# Patient Record
Sex: Female | Born: 1935 | Race: White | Hispanic: No | Marital: Married | Smoking: Never smoker
Health system: Southern US, Community
[De-identification: ages and names within clinical notes are randomized; demographics above are authoritative.]

## PROBLEM LIST (undated history)

## (undated) DIAGNOSIS — T884XXA Failed or difficult intubation, initial encounter: Secondary | ICD-10-CM

## (undated) DIAGNOSIS — T7840XA Allergy, unspecified, initial encounter: Secondary | ICD-10-CM

## (undated) DIAGNOSIS — F419 Anxiety disorder, unspecified: Secondary | ICD-10-CM

## (undated) DIAGNOSIS — K219 Gastro-esophageal reflux disease without esophagitis: Secondary | ICD-10-CM

## (undated) DIAGNOSIS — E079 Disorder of thyroid, unspecified: Secondary | ICD-10-CM

## (undated) HISTORY — DX: Allergy, unspecified, initial encounter: T78.40XA

## (undated) HISTORY — PX: TONSILLECTOMY: SUR1361

## (undated) HISTORY — PX: CATARACT EXTRACTION, BILATERAL: SHX1313

## (undated) HISTORY — PX: DIAGNOSTIC LAPAROSCOPY: SUR761

## (undated) HISTORY — PX: EYE SURGERY: SHX253

## (undated) HISTORY — DX: Disorder of thyroid, unspecified: E07.9

## (undated) HISTORY — PX: CHOLECYSTECTOMY: SHX55

## (undated) HISTORY — DX: Anxiety disorder, unspecified: F41.9

## (undated) HISTORY — DX: Gastro-esophageal reflux disease without esophagitis: K21.9

---

## 1998-08-13 ENCOUNTER — Other Ambulatory Visit: Admission: RE | Admit: 1998-08-13 | Discharge: 1998-08-13 | Payer: Self-pay | Admitting: Obstetrics and Gynecology

## 1999-08-29 ENCOUNTER — Other Ambulatory Visit: Admission: RE | Admit: 1999-08-29 | Discharge: 1999-08-29 | Payer: Self-pay | Admitting: Obstetrics and Gynecology

## 1999-10-27 ENCOUNTER — Ambulatory Visit (HOSPITAL_COMMUNITY): Admission: RE | Admit: 1999-10-27 | Discharge: 1999-10-27 | Payer: Self-pay | Admitting: Gastroenterology

## 2000-10-30 ENCOUNTER — Other Ambulatory Visit: Admission: RE | Admit: 2000-10-30 | Discharge: 2000-10-30 | Payer: Self-pay | Admitting: Obstetrics and Gynecology

## 2000-12-26 ENCOUNTER — Encounter: Payer: Self-pay | Admitting: Emergency Medicine

## 2000-12-26 ENCOUNTER — Emergency Department (HOSPITAL_COMMUNITY): Admission: EM | Admit: 2000-12-26 | Discharge: 2000-12-26 | Payer: Self-pay | Admitting: Emergency Medicine

## 2000-12-28 ENCOUNTER — Encounter (HOSPITAL_BASED_OUTPATIENT_CLINIC_OR_DEPARTMENT_OTHER): Payer: Self-pay | Admitting: General Surgery

## 2000-12-29 ENCOUNTER — Encounter (HOSPITAL_BASED_OUTPATIENT_CLINIC_OR_DEPARTMENT_OTHER): Payer: Self-pay | Admitting: General Surgery

## 2000-12-29 ENCOUNTER — Inpatient Hospital Stay (HOSPITAL_COMMUNITY): Admission: RE | Admit: 2000-12-29 | Discharge: 2000-12-30 | Payer: Self-pay | Admitting: General Surgery

## 2004-07-04 ENCOUNTER — Ambulatory Visit (HOSPITAL_COMMUNITY): Admission: RE | Admit: 2004-07-04 | Discharge: 2004-07-04 | Payer: Self-pay | Admitting: Gastroenterology

## 2004-12-12 ENCOUNTER — Other Ambulatory Visit: Admission: RE | Admit: 2004-12-12 | Discharge: 2004-12-12 | Payer: Self-pay | Admitting: Family Medicine

## 2012-06-14 ENCOUNTER — Other Ambulatory Visit: Payer: Self-pay | Admitting: Otolaryngology

## 2012-06-14 DIAGNOSIS — R221 Localized swelling, mass and lump, neck: Secondary | ICD-10-CM

## 2012-06-27 ENCOUNTER — Ambulatory Visit
Admission: RE | Admit: 2012-06-27 | Discharge: 2012-06-27 | Disposition: A | Discharge status: Home / Self Care | Payer: Medicare Other | Source: Ambulatory Visit | Attending: Otolaryngology | Admitting: Otolaryngology

## 2012-06-27 DIAGNOSIS — R221 Localized swelling, mass and lump, neck: Secondary | ICD-10-CM

## 2012-06-28 ENCOUNTER — Other Ambulatory Visit: Payer: Self-pay | Admitting: Otolaryngology

## 2012-06-28 DIAGNOSIS — E042 Nontoxic multinodular goiter: Secondary | ICD-10-CM

## 2012-07-02 ENCOUNTER — Other Ambulatory Visit (HOSPITAL_COMMUNITY)
Admission: RE | Admit: 2012-07-02 | Discharge: 2012-07-02 | Disposition: A | Discharge status: Home / Self Care | Payer: Medicare Other | Source: Ambulatory Visit | Attending: Interventional Radiology | Admitting: Interventional Radiology

## 2012-07-02 ENCOUNTER — Ambulatory Visit
Admission: RE | Admit: 2012-07-02 | Discharge: 2012-07-02 | Disposition: A | Discharge status: Home / Self Care | Payer: Medicare Other | Source: Ambulatory Visit | Attending: Otolaryngology | Admitting: Otolaryngology

## 2012-07-02 DIAGNOSIS — E042 Nontoxic multinodular goiter: Secondary | ICD-10-CM

## 2012-07-02 DIAGNOSIS — E041 Nontoxic single thyroid nodule: Secondary | ICD-10-CM | POA: Insufficient documentation

## 2013-06-17 ENCOUNTER — Other Ambulatory Visit: Payer: Self-pay | Admitting: Otolaryngology

## 2013-06-17 DIAGNOSIS — H6120 Impacted cerumen, unspecified ear: Secondary | ICD-10-CM

## 2013-06-24 ENCOUNTER — Other Ambulatory Visit: Payer: Self-pay | Admitting: Otolaryngology

## 2013-06-24 DIAGNOSIS — H6120 Impacted cerumen, unspecified ear: Secondary | ICD-10-CM

## 2013-06-30 ENCOUNTER — Ambulatory Visit
Admission: RE | Admit: 2013-06-30 | Discharge: 2013-06-30 | Disposition: A | Discharge status: Home / Self Care | Payer: Medicare Other | Source: Ambulatory Visit | Attending: Otolaryngology | Admitting: Otolaryngology

## 2013-06-30 DIAGNOSIS — H6120 Impacted cerumen, unspecified ear: Secondary | ICD-10-CM

## 2014-02-01 ENCOUNTER — Ambulatory Visit (INDEPENDENT_AMBULATORY_CARE_PROVIDER_SITE_OTHER): Payer: Medicare Other | Admitting: Family Medicine

## 2014-02-01 VITALS — BP 130/80 | HR 77 | Temp 97.9°F | Resp 16 | Ht 64.5 in | Wt 133.2 lb

## 2014-02-01 DIAGNOSIS — L282 Other prurigo: Secondary | ICD-10-CM

## 2014-02-01 DIAGNOSIS — R21 Rash and other nonspecific skin eruption: Secondary | ICD-10-CM

## 2014-02-01 MED ORDER — TRIAMCINOLONE ACETONIDE 0.1 % EX CREA: 1.0000 "application " | TOPICAL_CREAM | Freq: Three times a day (TID) | CUTANEOUS | Status: DC | PRN

## 2014-02-01 MED ORDER — MUPIROCIN 2 % EX OINT: TOPICAL_OINTMENT | CUTANEOUS | Status: DC

## 2014-02-01 NOTE — Progress Notes (Signed)
 Chief Complaint:  Chief Complaint  Patient presents with  . other    x3 days ago pt states she has been having little discomfort on the middle left area of her back; she believes it is shingles; OTC Hydrocortisone with little relief  . Fever    pt states she had a fever x1 day ago    HPI: Jacqueline Harrison is a 78 y.o. female who is here for  rash on her left side of her back started about 3 days ago, she is worried about shingles. SHe ahs tried otc hydrocortisone with minimal releif. It actaully feels better today, she states she ahd subjective fevers, no other sxs. Worried about shingles, no blistering of rash, no pain , numbness or tingling.    Past Medical History  Diagnosis Date  . Allergy   . Anxiety   . Cataract   . GERD (gastroesophageal reflux disease)   . Thyroid disease    Past Surgical History  Procedure Laterality Date  . Cholecystectomy    . Eye surgery     History   Social History  . Marital Status: Married    Spouse Name: N/A    Number of Children: N/A  . Years of Education: N/A   Social History Main Topics  . Smoking status: Never Smoker   . Smokeless tobacco: None  . Alcohol Use: None  . Drug Use: None  . Sexual Activity: None   Other Topics Concern  . None   Social History Narrative  . None   Family History  Problem Relation Age of Onset  . Cancer Mother   . Hypertension Mother   . Cancer Father   . Mental illness Father   . Heart disease Father   . Heart disease Maternal Grandmother   . Hyperlipidemia Maternal Grandmother   . Cancer Maternal Grandfather   . Mental illness Maternal Grandfather   . Heart disease Paternal Grandfather   . Cancer Sister   . Hypertension Sister   . Mental illness Sister   . Cancer Brother    Allergies  Allergen Reactions  . Peanut-Containing Drug Products Nausea And Vomiting   Prior to Admission medications   Medication Sig Start Date End Date Taking? Authorizing Provider  ALPRAZolam Prudy Feeler)  1 MG tablet Take 1 mg by mouth daily.   Yes Historical Provider, MD  CALCIUM PO Take by mouth.   Yes Historical Provider, MD  Cholecalciferol (VITAMIN D PO) Take by mouth.   Yes Historical Provider, MD  Omega-3 Fatty Acids (FISH OIL PO) Take by mouth.   Yes Historical Provider, MD  mupirocin ointment (BACTROBAN) 2 % Apply to affected area BID for 7 days 02/01/14    P , DO  triamcinolone cream (KENALOG) 0.1 % Apply 1 application topically 3 (three) times daily as needed. 02/01/14    P , DO     ROS: The patient denies night sweats, unintentional weight loss, chest pain, palpitations, wheezing, dyspnea on exertion, nausea, vomiting, abdominal pain, dysuria, hematuria, melena, numbness, weakness, or tingling.   All other systems have been reviewed and were otherwise negative with the exception of those mentioned in the HPI and as above.    PHYSICAL EXAM: Filed Vitals:   02/01/14 0956  BP: 130/80  Pulse: 77  Temp: 97.9 F (36.6 C)  Resp: 16   Filed Vitals:   02/01/14 0956  Height: 5' 4.5" (1.638 m)  Weight: 133 lb 3.2 oz (60.419 kg)   Body mass  index is 22.52 kg/(m^2).  General: Alert, no acute distress HEENT:  Normocephalic, atraumatic, oropharynx patent. EOMI, PERRLA Cardiovascular:  Regular rate and rhythm, no rubs murmurs or gallops.  No Carotid bruits, radial pulse intact. No pedal edema.  Respiratory: Clear to auscultation bilaterally.  No wheezes, rales, or rhonchi.  No cyanosis, no use of accessory musculature GI: No organomegaly, abdomen is soft and non-tender, positive bowel sounds.  No masses. Skin: + papular erythematous rash in 3 lesions that look like bug bites Neurologic: Facial musculature symmetric. Psychiatric: Patient is appropriate throughout our interaction. Lymphatic: No cervical lymphadenopathy Musculoskeletal: Gait intact.   LABS: No results found for this or any previous visit.   EKG/XRAY:   Primary read interpreted by Dr. Conley Rolls at  [facility].   ASSESSMENT/PLAN: Encounter Diagnoses  Name Primary?  . Rash and nonspecific skin eruption Yes  . Pruritic rash    More bug bite in appearance, she does go walking with her husband daily She does not appear to have shingles Will try triamicinolone cream  and bactroban  F/u prn by phone if worsening rash then will rx  abx , she will call me  Gross sideeffects, risk and benefits, and alternatives of medications d/w patient. Patient is aware that all medications have potential sideeffects and we are unable to predict every sideeffect or drug-drug interaction that may occur.  Hamilton Capri PHUONG, DO 02/01/2014 10:43 AM

## 2014-08-26 DIAGNOSIS — E785 Hyperlipidemia, unspecified: Secondary | ICD-10-CM | POA: Diagnosis not present

## 2014-08-26 DIAGNOSIS — M810 Age-related osteoporosis without current pathological fracture: Secondary | ICD-10-CM | POA: Diagnosis not present

## 2014-08-26 DIAGNOSIS — I10 Essential (primary) hypertension: Secondary | ICD-10-CM | POA: Diagnosis not present

## 2014-08-26 DIAGNOSIS — E042 Nontoxic multinodular goiter: Secondary | ICD-10-CM | POA: Diagnosis not present

## 2014-08-26 DIAGNOSIS — N390 Urinary tract infection, site not specified: Secondary | ICD-10-CM | POA: Diagnosis not present

## 2014-08-26 DIAGNOSIS — R8290 Unspecified abnormal findings in urine: Secondary | ICD-10-CM | POA: Diagnosis not present

## 2014-09-02 DIAGNOSIS — Z0000 Encounter for general adult medical examination without abnormal findings: Secondary | ICD-10-CM | POA: Diagnosis not present

## 2014-09-02 DIAGNOSIS — H269 Unspecified cataract: Secondary | ICD-10-CM | POA: Diagnosis not present

## 2014-09-02 DIAGNOSIS — I10 Essential (primary) hypertension: Secondary | ICD-10-CM | POA: Diagnosis not present

## 2014-09-02 DIAGNOSIS — M810 Age-related osteoporosis without current pathological fracture: Secondary | ICD-10-CM | POA: Diagnosis not present

## 2014-09-02 DIAGNOSIS — E042 Nontoxic multinodular goiter: Secondary | ICD-10-CM | POA: Diagnosis not present

## 2014-09-07 DIAGNOSIS — Z1212 Encounter for screening for malignant neoplasm of rectum: Secondary | ICD-10-CM | POA: Diagnosis not present

## 2014-10-20 ENCOUNTER — Other Ambulatory Visit: Payer: Self-pay | Admitting: Gastroenterology

## 2014-10-20 DIAGNOSIS — Z800 Family history of malignant neoplasm of digestive organs: Secondary | ICD-10-CM | POA: Diagnosis not present

## 2014-10-20 DIAGNOSIS — K5730 Diverticulosis of large intestine without perforation or abscess without bleeding: Secondary | ICD-10-CM | POA: Diagnosis not present

## 2014-10-20 DIAGNOSIS — Z86010 Personal history of colonic polyps: Secondary | ICD-10-CM | POA: Diagnosis not present

## 2014-10-20 DIAGNOSIS — Z1211 Encounter for screening for malignant neoplasm of colon: Secondary | ICD-10-CM | POA: Diagnosis not present

## 2014-12-11 ENCOUNTER — Encounter (HOSPITAL_COMMUNITY): Payer: Self-pay | Admitting: *Deleted

## 2014-12-11 NOTE — Progress Notes (Signed)
12-11-14 1530 Dr. Dot LanesG. Rose, anesthesiologist made aware of patient comments "small mouth, airway- potential for difficult intubation unless small tube use. Jona Zappone,RN

## 2014-12-22 ENCOUNTER — Encounter (HOSPITAL_COMMUNITY): Payer: Self-pay

## 2014-12-22 ENCOUNTER — Encounter (HOSPITAL_COMMUNITY)
Admission: RE | Disposition: A | Discharge status: Home / Self Care | Payer: Self-pay | Source: Ambulatory Visit | Attending: Gastroenterology

## 2014-12-22 ENCOUNTER — Ambulatory Visit (HOSPITAL_COMMUNITY)
Admission: RE | Admit: 2014-12-22 | Discharge: 2014-12-22 | Disposition: A | Discharge status: Home / Self Care | Payer: Medicare Other | Source: Ambulatory Visit | Attending: Gastroenterology | Admitting: Gastroenterology

## 2014-12-22 ENCOUNTER — Ambulatory Visit (HOSPITAL_COMMUNITY): Payer: Medicare Other | Admitting: Anesthesiology

## 2014-12-22 DIAGNOSIS — F419 Anxiety disorder, unspecified: Secondary | ICD-10-CM | POA: Diagnosis not present

## 2014-12-22 DIAGNOSIS — Z1211 Encounter for screening for malignant neoplasm of colon: Secondary | ICD-10-CM | POA: Diagnosis not present

## 2014-12-22 DIAGNOSIS — Z79899 Other long term (current) drug therapy: Secondary | ICD-10-CM | POA: Diagnosis not present

## 2014-12-22 DIAGNOSIS — K219 Gastro-esophageal reflux disease without esophagitis: Secondary | ICD-10-CM | POA: Diagnosis not present

## 2014-12-22 DIAGNOSIS — Z86010 Personal history of colonic polyps: Secondary | ICD-10-CM | POA: Diagnosis not present

## 2014-12-22 DIAGNOSIS — K640 First degree hemorrhoids: Secondary | ICD-10-CM | POA: Insufficient documentation

## 2014-12-22 DIAGNOSIS — Z800 Family history of malignant neoplasm of digestive organs: Secondary | ICD-10-CM | POA: Insufficient documentation

## 2014-12-22 DIAGNOSIS — K5790 Diverticulosis of intestine, part unspecified, without perforation or abscess without bleeding: Secondary | ICD-10-CM | POA: Diagnosis not present

## 2014-12-22 DIAGNOSIS — K5730 Diverticulosis of large intestine without perforation or abscess without bleeding: Secondary | ICD-10-CM | POA: Diagnosis not present

## 2014-12-22 DIAGNOSIS — K648 Other hemorrhoids: Secondary | ICD-10-CM | POA: Diagnosis not present

## 2014-12-22 HISTORY — PX: COLONOSCOPY WITH PROPOFOL: SHX5780

## 2014-12-22 HISTORY — DX: Failed or difficult intubation, initial encounter: T88.4XXA

## 2014-12-22 SURGERY — COLONOSCOPY WITH PROPOFOL
Anesthesia: Monitor Anesthesia Care

## 2014-12-22 MED ORDER — PROPOFOL 10 MG/ML IV BOLUS
INTRAVENOUS | Status: DC | PRN
  Administered 2014-12-22 (×2): 20 mg via INTRAVENOUS

## 2014-12-22 MED ORDER — PROPOFOL INFUSION 10 MG/ML OPTIME
INTRAVENOUS | Status: DC | PRN
  Administered 2014-12-22: 140 ug/kg/min via INTRAVENOUS

## 2014-12-22 MED ORDER — PROPOFOL 10 MG/ML IV BOLUS
INTRAVENOUS | Status: AC
  Filled 2014-12-22: qty 20

## 2014-12-22 MED ORDER — LACTATED RINGERS IV SOLN
INTRAVENOUS | Status: DC
Start: 2014-12-22 — End: 2014-12-22
  Administered 2014-12-22: 1000 mL via INTRAVENOUS

## 2014-12-22 MED ORDER — SODIUM CHLORIDE 0.9 % IV SOLN: INTRAVENOUS | Status: DC

## 2014-12-22 MED ORDER — FENTANYL CITRATE (PF) 100 MCG/2ML IJ SOLN
INTRAMUSCULAR | Status: DC | PRN
  Administered 2014-12-22: 25 ug via INTRAVENOUS

## 2014-12-22 MED ORDER — FENTANYL CITRATE (PF) 100 MCG/2ML IJ SOLN
INTRAMUSCULAR | Status: AC
  Filled 2014-12-22: qty 2

## 2014-12-22 MED ORDER — PROPOFOL 10 MG/ML IV BOLUS
INTRAVENOUS | Status: AC
Start: 2014-12-22 — End: 2014-12-22
  Filled 2014-12-22: qty 20

## 2014-12-22 SURGICAL SUPPLY — 21 items

## 2014-12-22 NOTE — Op Note (Signed)
[facility] [address] [place], [address]   OPERATIVE PROCEDURE REPORT  PATIENT: Jacqueline Harrison, Jacqueline Harrison  MR#: [id number] BIRTHDATE: 09/07/1935 GENDER: female ENDOSCOPIST: Lorenza Burton, MD ASSISTANT:   Olene Craven, technician & Omelia Blackwater, RN. PROCEDURE DATE: 01-06-15 PRE-PROCEDURE PREPARATION: Patient fasted for 4 hours prior to procedure. The patient was prepped with a gallon of Golytely the night prior to the procedure.  The patient has fasted for 4 hours prior to the procedure PRE-PROCEDURE PHYSICAL: Patient has stable vital signs.  Neck is supple.  There is no JVD, thyromegaly or LAD.  Chest clear to auscultation.  S1 and S2 regular.  Abdomen soft, non-distended, non-tender with NABS. PROCEDURE:     Colonoscopy, diagnostic ASA CLASS:     Class II INDICATIONS:     1. Personal history of adenomatous colonic polyps 2. Colorectal cancer screening-family history of colon cancer in both parents and a paternal cousin. MEDICATIONS:     Monitored anesthesia care.  DESCRIPTION OF PROCEDURE: After the risks, benefits, and alternatives of the procedure were thoroughly explained [including a 10% missed rate of cancer and polyps], informed consent was obtained. Digital rectal exam was performed. The Pentax adult Colonscope [phone]  was introduced through the anus  and advanced to the terminal ileum which was intubated for a short distance , limited by No adverse events experienced. The quality of the prep was good. Multiple washes were done. Small lesions could be missed. The instrument was then slowly withdrawn as the colon was fully examined. Estimated blood loss is zero unless otherwise noted in this procedure report.     COLON FINDINGS: There was mild diverticulosis noted in the sigmoid colon. The rest of the colonic mucosa appeared healthy with a normal vascular pattern. No masses, polyps, or AVMs were noted. The appendiceal orifice and the ICV  were identified and photographed. The terminal ileum appeared normal.  Retroflexed views revealed small non-bleeding internal hemorrhoids. The patient tolerated the procedure without immediate complications. The scope was then withdrawn from the patient and the procedure terminated.  TIME TO CECUM:   3 minutes 00 seconds WITHDRAW TIME:  6 minutes 00 seconds  IMPRESSION:     1.  Mild diverticulosis was noted in the sigmoid colon 2.  Small internal grade I hemorrhoids. 3. Otherwise normal colonoscopy upto the terminal ileum.  RECOMMENDATIONS:     1.  Continue current medications 2.  Continue surveillance 3.  High fiber diet with liberal fluid intake. 4.  OP follow-up is advised on a PRN basis.  REPEAT EXAM:      In 3 years  for a repeat colonoscopy.  If the patient has any abnormal GI symptoms in the interim, she have been advised to contact the office as soon as possible for further recommendations.   REFERRED VH:QIONGEX Tisovec, M.D. eSigned:  Lorenza Burton, MD Jan 06, 2015 8:02 AM  CPT CODES:     [phone] Colonoscopy, flexible, proximal to splenic flexure; diagnostic, with or without collection of specimen(s) by brushing or washing, with or without colon decompression (separate procedure) ICD CODES:     K57.30, Diverticulosis Z86.010 Personal history of colonic  polyps; Z80.0 Family history of colon cancer Z12.11 Encounter for screening for malignant neoplasm of colon  The ICD and CPT codes recommended by this software are interpretations from the data that the clinical staff has captured with the software.  The verification of the translation of this report to the ICD and CPT codes and modifiers is the sole responsibility  of the health care institution and practicing physician where this report was generated.  [employer] will not be held responsible for the validity of the ICD and CPT codes included on this report.  AMA assumes no liability for data  contained or not contained herein. CPT is a Publishing rights managerregistered trademark of the [employer].  PATIENT NAME:  Jacqueline Harrison, Jacqueline Harrison MR#: [id number]

## 2014-12-22 NOTE — Anesthesia Postprocedure Evaluation (Signed)
  Anesthesia Post-op Note  Patient: Jacqueline Harrison  Procedure(s) Performed: Procedure(s) (LRB): COLONOSCOPY WITH PROPOFOL (N/A)  Patient Location: PACU  Anesthesia Type: MAC  Level of Consciousness: awake and alert   Airway and Oxygen Therapy: Patient Spontanous Breathing  Post-op Pain: mild  Post-op Assessment: Post-op Vital signs reviewed, Patient's Cardiovascular Status Stable, Respiratory Function Stable, Patent Airway and No signs of Nausea or vomiting  Last Vitals:  Filed Vitals:   12/22/14 0815  BP: 166/64  Pulse: 58  Temp:   Resp: 15    Post-op Vital Signs: stable   Complications: No apparent anesthesia complications

## 2014-12-22 NOTE — Transfer of Care (Signed)
Immediate Anesthesia Transfer of Care Note  Patient: Jacqueline HidalgoSandra J Awbrey  Procedure(s) Performed: Procedure(s): COLONOSCOPY WITH PROPOFOL (N/A)  Patient Location: Endoscopy Unit  Anesthesia Type:MAC  Level of Consciousness: awake  Airway & Oxygen Therapy: Patient Spontanous Breathing and Patient connected to face mask oxygen  Post-op Assessment: Report given to RN and Post -op Vital signs reviewed and stable  Post vital signs: Reviewed and stable  Last Vitals:  Filed Vitals:   12/22/14 0703  BP: 207/71  Pulse: 78  Temp: 36.7 C  Resp: 16    Complications: No apparent anesthesia complications

## 2014-12-22 NOTE — Anesthesia Preprocedure Evaluation (Signed)
Anesthesia Evaluation  Patient identified by MRN, date of birth, ID band Patient awake    Reviewed: Allergy & Precautions, NPO status , Patient's Chart, lab work & pertinent test results  History of Anesthesia Complications (+) DIFFICULT AIRWAY  Airway Mallampati: III  TM Distance: >3 FB Neck ROM: Full    Dental no notable dental hx.    Pulmonary neg pulmonary ROS,  breath sounds clear to auscultation  Pulmonary exam normal       Cardiovascular negative cardio ROS Normal cardiovascular examRhythm:Regular Rate:Normal     Neuro/Psych negative neurological ROS  negative psych ROS   GI/Hepatic negative GI ROS, Neg liver ROS,   Endo/Other  negative endocrine ROS  Renal/GU negative Renal ROS  negative genitourinary   Musculoskeletal negative musculoskeletal ROS (+)   Abdominal   Peds negative pediatric ROS (+)  Hematology negative hematology ROS (+)   Anesthesia Other Findings   Reproductive/Obstetrics negative OB ROS                             Anesthesia Physical Anesthesia Plan  ASA: II  Anesthesia Plan: MAC   Post-op Pain Management:    Induction:   Airway Management Planned: Simple Face Mask  Additional Equipment:   Intra-op Plan:   Post-operative Plan:   Informed Consent: I have reviewed the patients History and Physical, chart, labs and discussed the procedure including the risks, benefits and alternatives for the proposed anesthesia with the patient or authorized representative who has indicated his/her understanding and acceptance.   Dental advisory given  Plan Discussed with: CRNA  Anesthesia Plan Comments:         Anesthesia Quick Evaluation

## 2014-12-22 NOTE — Discharge Instructions (Signed)
Monitored Anesthesia Care Monitored anesthesia care is an anesthesia service for a medical procedure. Anesthesia is the loss of the ability to feel pain. It is produced by medicines called anesthetics. It may affect a small area of your body (local anesthesia), a large area of your body (regional anesthesia), or your entire body (general anesthesia). The need for monitored anesthesia care depends your procedure, your condition, and the potential need for regional or general anesthesia. It is often provided during procedures where:   General anesthesia may be needed if there are complications. This is because you need special care when you are under general anesthesia.   You will be under local or regional anesthesia. This is so that you are able to have higher levels of anesthesia if needed.   You will receive calming medicines (sedatives). This is especially the case if sedatives are given to put you in a semi-conscious state of relaxation (deep sedation). This is because the amount of sedative needed to produce this state can be hard to predict. Too much of a sedative can produce general anesthesia. Monitored anesthesia care is performed by one or more health care providers who have special training in all types of anesthesia. You will need to meet with these health care providers before your procedure. During this meeting, they will ask you about your medical history. They will also give you instructions to follow. (For example, you will need to stop eating and drinking before your procedure. You may also need to stop or change medicines you are taking.) During your procedure, your health care providers will stay with you. They will:   Watch your condition. This includes watching your blood pressure, breathing, and level of pain.   Diagnose and treat problems that occur.   Give medicines if they are needed. These may include calming medicines (sedatives) and anesthetics.   Make sure you are  comfortable.  Having monitored anesthesia care does not necessarily mean that you will be under anesthesia. It does mean that your health care providers will be able to manage anesthesia if you need it or if it occurs. It also means that you will be able to have a different type of anesthesia than you are having if you need it. When your procedure is complete, your health care providers will continue to watch your condition. They will make sure any medicines wear off before you are allowed to go home.  Document Released: 02/22/2005 Document Revised: 10/13/2013 Document Reviewed: 07/10/2012 [facility] Patient Information 2015 [place]. This information is not intended to replace advice given to you by your health care provider. Make sure you discuss any questions you have with your health care provider. Colonoscopy A colonoscopy is an exam to look at the entire large intestine (colon). This exam can help find problems such as tumors, polyps, inflammation, and areas of bleeding. The exam takes about 1 hour.  LET [facility] LLCYOUR HEALTH CARE PROVIDER KNOW ABOUT:   Any allergies you have.  All medicines you are taking, including vitamins, herbs, eye drops, creams, and over-the-counter medicines.  Previous problems you or members of your family have had with the use of anesthetics.  Any blood disorders you have.  Previous surgeries you have had.  Medical conditions you have. RISKS AND COMPLICATIONS  Generally, this is a safe procedure. However, as with any procedure, complications can occur. Possible complications include:  Bleeding.  Tearing or rupture of the colon wall.  Reaction to medicines given during the exam.  Infection (rare). BEFORE  THE PROCEDURE   Ask your health care provider about changing or stopping your regular medicines.  You may be prescribed an oral bowel prep. This involves drinking a large amount of medicated liquid, starting the day before your procedure. The liquid will  cause you to have multiple loose stools until your stool is almost clear or light green. This cleans out your colon in preparation for the procedure.  Do not eat or drink anything else once you have started the bowel prep, unless your health care provider tells you it is safe to do so.  Arrange for someone to drive you home after the procedure. PROCEDURE   You will be given medicine to help you relax (sedative).  You will lie on your side with your knees bent.  A long, flexible tube with a light and camera on the end (colonoscope) will be inserted through the rectum and into the colon. The camera sends video back to a computer screen as it moves through the colon. The colonoscope also releases carbon dioxide gas to inflate the colon. This helps your health care provider see the area better.  During the exam, your health care provider may take a small tissue sample (biopsy) to be examined under a microscope if any abnormalities are found.  The exam is finished when the entire colon has been viewed. AFTER THE PROCEDURE   Do not drive for 24 hours after the exam.  You may have a small amount of blood in your stool.  You may pass moderate amounts of gas and have mild abdominal cramping or bloating. This is caused by the gas used to inflate your colon during the exam.  Ask when your test results will be ready and how you will get your results. Make sure you get your test results. Document Released: 05/26/2000 Document Revised: 03/19/2013 Document Reviewed: 02/03/2013 [facility] Patient Information 2015 [place], [place]. This information is not intended to replace advice given to you by your health care provider. Make sure you discuss any questions you have with your health care provider.

## 2014-12-22 NOTE — H&P (Signed)
Jacqueline Harrison is an 79 y.o. Jacqueline Harrison.   Chief Complaint: Colorectal cancer screening. HPI: 79 year old white female withb a personal history of adenomatous polyps and a family history of colon cancer in both her pareents is here for a screening colonoscopy. She has a history of difficulty intubation.She3 denies having any GI problems at this time. She is a known DIFFICULT INTUBATION.  Past Medical History  Diagnosis Date  . Allergy   . Anxiety   . GERD (gastroesophageal reflux disease)   . Thyroid disease     followed for 2 yrs "thyroid nodules" "stable"  . Complication of anesthesia     "was told has a small mouth"  . Difficult intubation     Potential for difficult intubation if small tube not used" per patient -had issues with this Cholecystectomy 2009-[facility].   Past Surgical History  Procedure Laterality Date  . Cholecystectomy    . Eye surgery    . Tonsillectomy    . Cataract extraction, bilateral Bilateral   . Diagnostic laparoscopy      diagnostic- negative findings -to evaluate ovaries.   Family History  Problem Relation Age of Onset  . Cancer Mother   . Hypertension Mother   . Cancer Father   . Mental illness Father   . Heart disease Father   . Heart disease Maternal Grandmother   . Hyperlipidemia Maternal Grandmother   . Cancer Maternal Grandfather   . Mental illness Maternal Grandfather   . Heart disease Paternal Grandfather   . Cancer Sister   . Hypertension Sister   . Mental illness Sister   . Cancer Brother    Social History:  reports that she has never smoked. She does not have any smokeless tobacco history on file. She reports that she drinks alcohol. She reports that she does not use illicit drugs.  Allergies:  Allergies  Allergen Reactions  . Peanut-Containing Drug Products Nausea And Vomiting   Medications Prior to Admission  Medication Sig Dispense Refill  . ALPRAZolam (XANAX) 1 MG tablet Take 1 mg by mouth at bedtime.     Marland Kitchen. CALCIUM PO Take 1  tablet by mouth 2 (two) times daily. + Magnesium + Zinc    . Carboxymethylcellulose Sodium (REFRESH PLUS OP) Apply 1-2 drops to eye daily.    . Cholecalciferol (VITAMIN D PO) Take 1 tablet by mouth every morning.     . fluticasone (FLONASE) 50 MCG/ACT nasal spray Place 1-2 sprays into both nostrils daily as needed for allergies or rhinitis.    . Multiple Vitamin (MULTIVITAMIN WITH MINERALS) TABS tablet Take 1 tablet by mouth every morning.    . polyvinyl alcohol (LIQUIFILM TEARS) 1.4 % ophthalmic solution Place 1-2 drops into both eyes daily.    Marland Kitchen. acetaminophen (TYLENOL) 500 MG tablet Take 500-1,000 mg by mouth every 6 (six) hours as needed for moderate pain or headache.    . mupirocin ointment (BACTROBAN) 2 % Apply to affected area BID for 7 days (Patient not taking: Reported on 12/02/2014) 30 g 0  . naproxen sodium (ANAPROX) 220 MG tablet Take 220-440 mg by mouth daily as needed (pain).    . Omega-3 Fatty Acids (FISH OIL PO) Take 1 capsule by mouth every morning.     . triamcinolone cream (KENALOG) 0.1 % Apply 1 application topically 3 (three) times daily as needed. (Patient not taking: Reported on 12/02/2014) 30 g 0   No results found for this or any previous visit (from the past 48 hour(s)). No  results found.  Review of Systems  Constitutional: Negative.   HENT: Negative.   Eyes: Negative.   Respiratory: Negative.   Cardiovascular: Negative.   Gastrointestinal: Negative.   Musculoskeletal: Negative.   Skin: Negative.   Neurological: Negative.   Psychiatric/Behavioral: The patient is nervous/anxious.    Blood pressure 207/71, pulse 78, temperature 98.1 F (36.7 C), temperature source Oral, resp. rate 16, height  (1.626 m), weight 60.328 kg (133 lb), SpO2 100 %. Physical Exam  Constitutional: She is oriented to person, place, and time. She appears well-developed and well-nourished.  HENT:  Head: Normocephalic and atraumatic.  Eyes: Conjunctivae and EOM are normal. Pupils are  equal, round, and reactive to light.  Neck: Normal range of motion. Neck supple.  Cardiovascular: Normal rate and regular rhythm.   Respiratory: Effort normal and breath sounds normal.  GI: Soft. Bowel sounds are normal.  Musculoskeletal: Normal range of motion.  Neurological: She is alert and oriented to person, place, and time.  Skin: Skin is warm and dry.  Psychiatric: She has a normal mood and affect. Her behavior is normal. Judgment and thought content normal.    Assessment/Plan Colorectal cancer screening and family history of colon cancer and personal history of colonic polyps-proceed with a colonoscopy at this time. She office notes for details.  [name] 12/22/2014, 7:27 AM

## 2014-12-23 ENCOUNTER — Encounter (HOSPITAL_COMMUNITY): Payer: Self-pay | Admitting: Gastroenterology

## 2014-12-27 IMAGING — US US SOFT TISSUE HEAD/NECK
1 series · 14 of 25 positions shown · non-contrast
Comparison: None.

CLINICAL DATA: Neck mass on physical exam

THYROID ULTRASOUND
TECHNIQUE: Ultrasound examination of the thyroid gland and adjacent
soft tissues was performed.

[Series 1: us soft tissue head/neck · 0.08mm/px · 14 of 52 slices shown]
[im 1/52]
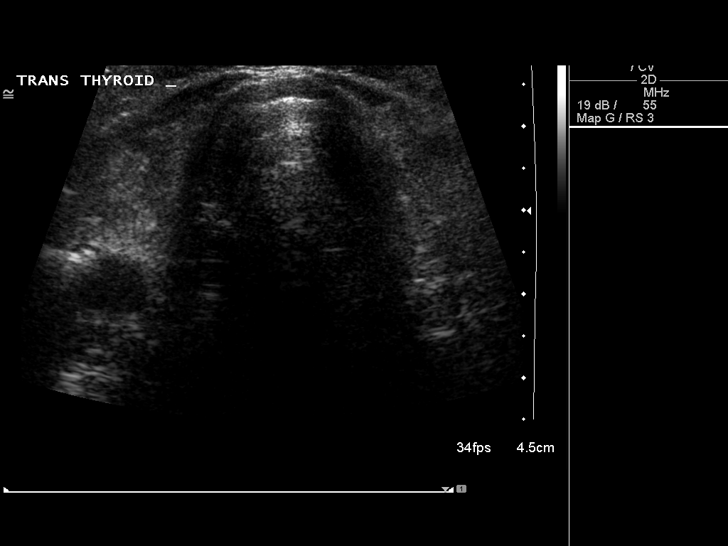
[im 5/52]
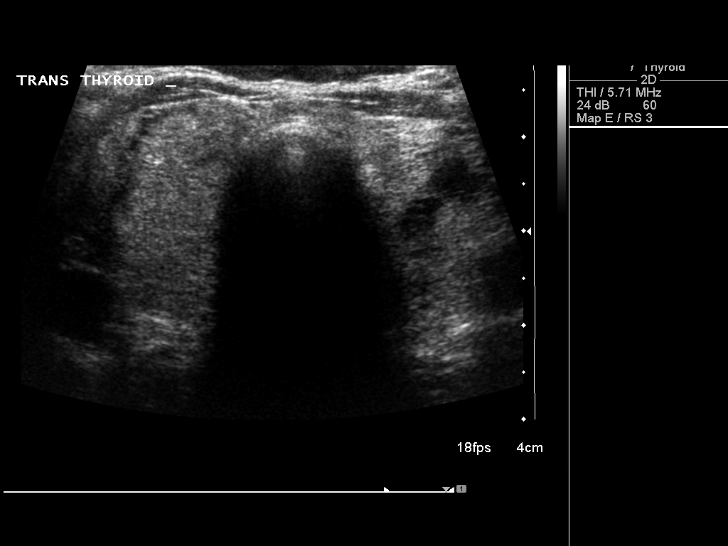
[im 9/52]
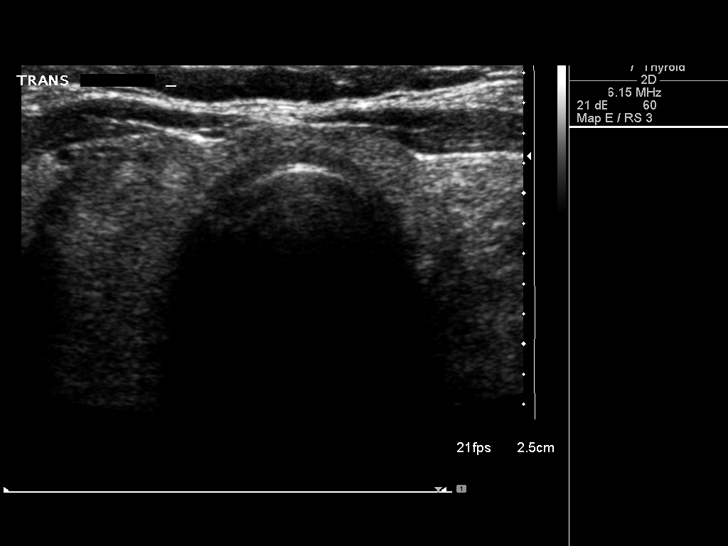
[im 13/52]
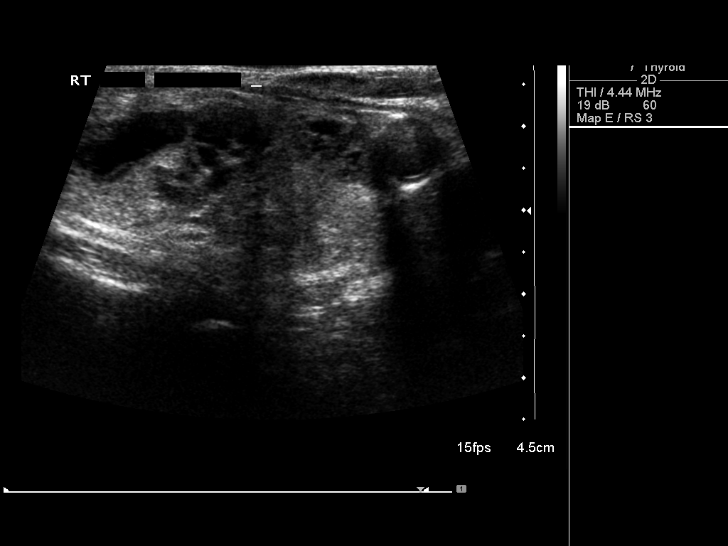
[im 18/52]
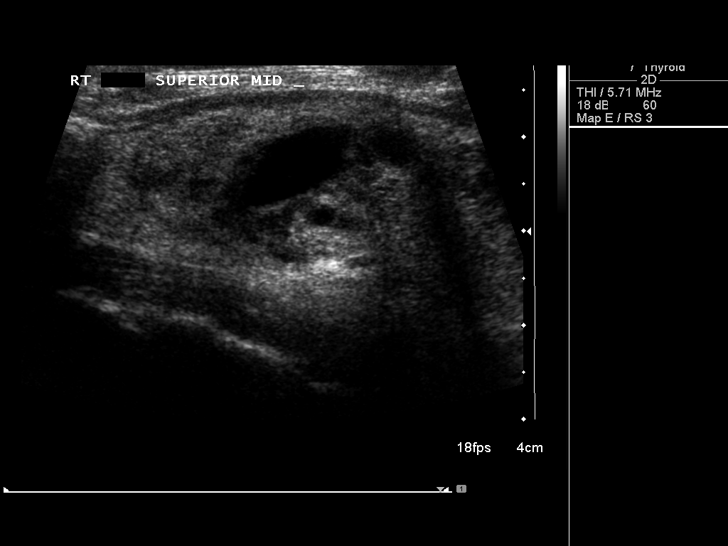
[im 20/52]
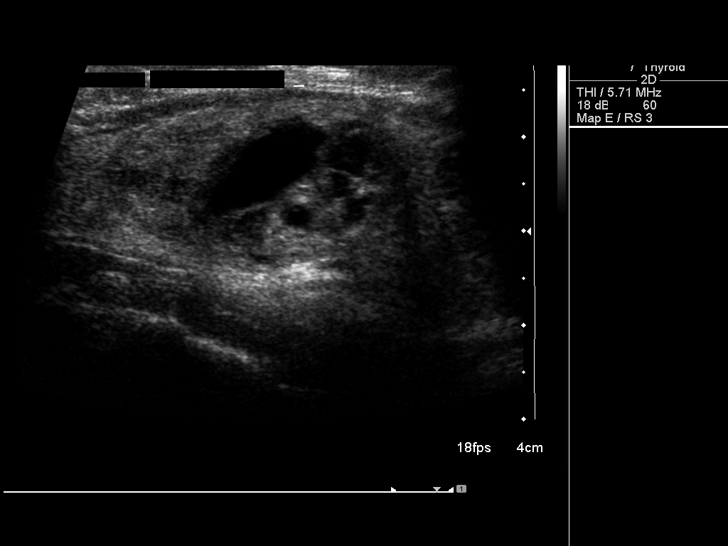
[im 24/52]
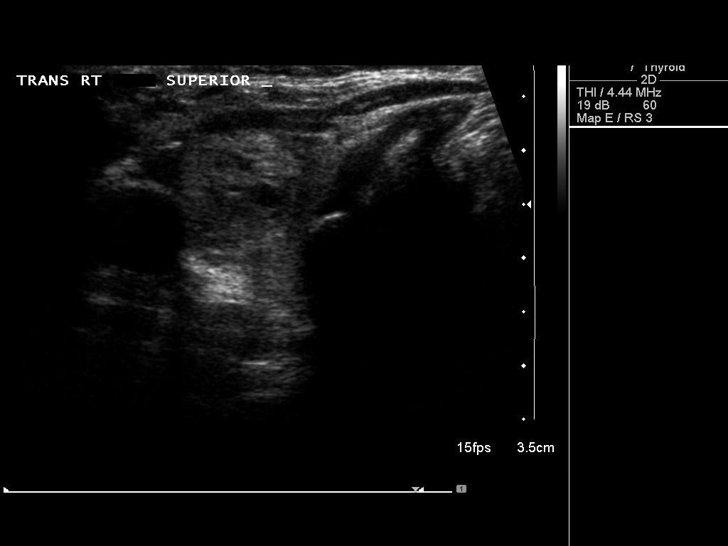
[im 28/52]
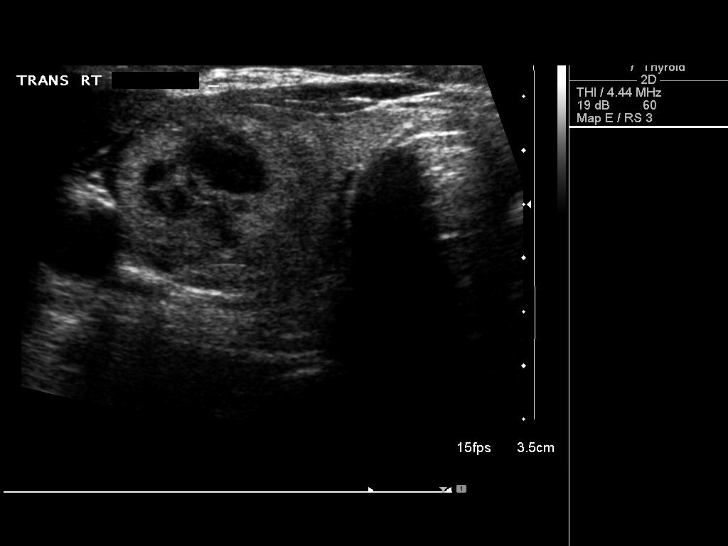
[im 32/52]
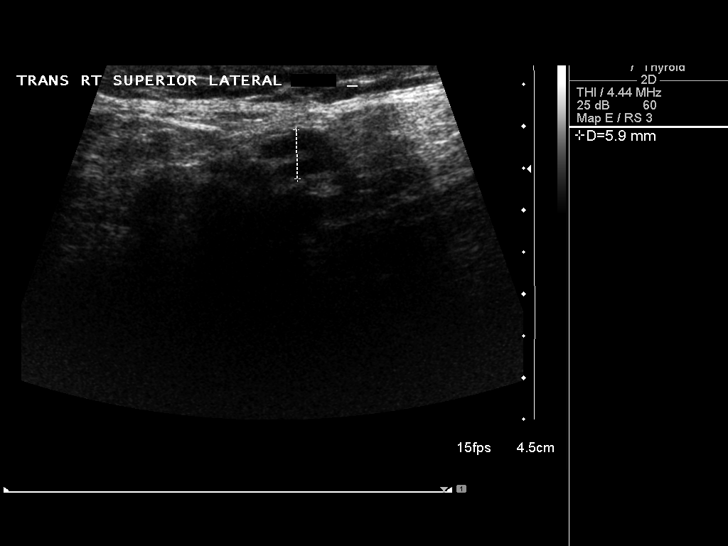
[im 35/52]
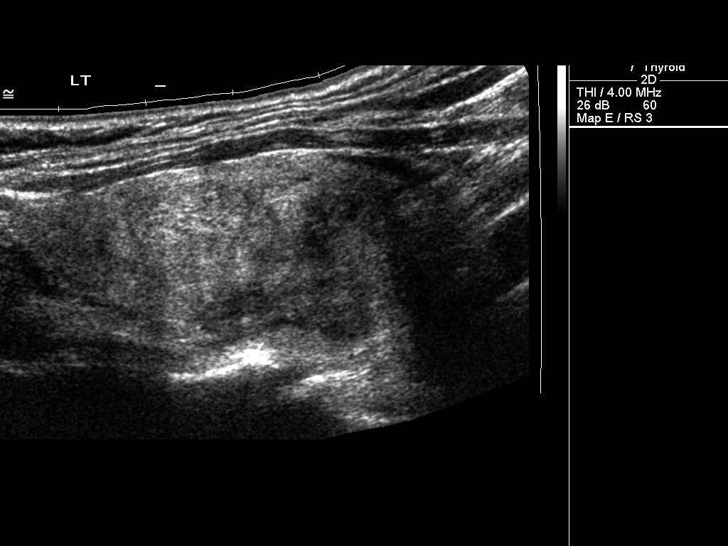
[im 39/52]
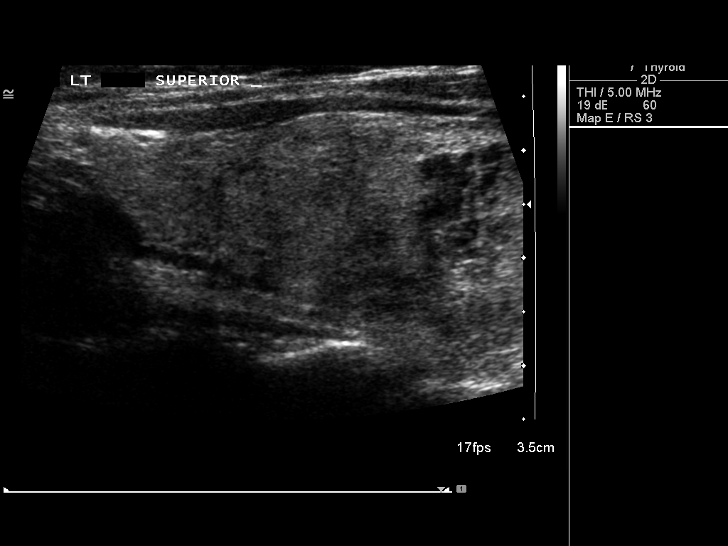
[im 43/52]
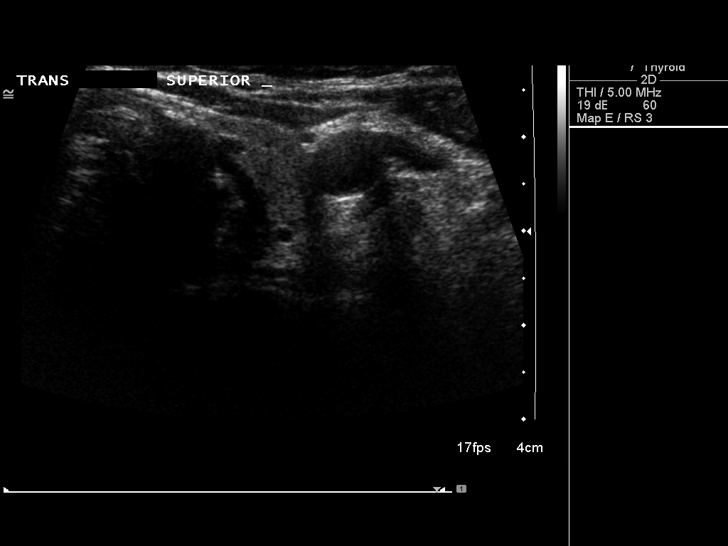
[im 47/52]
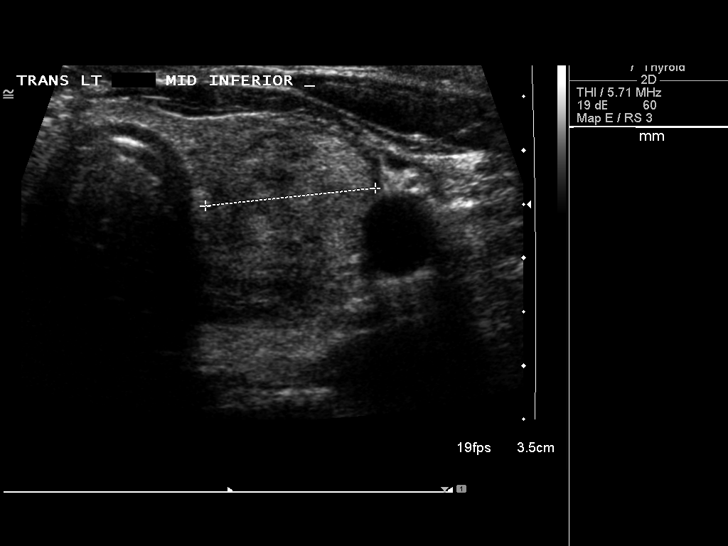
[im 52/52]
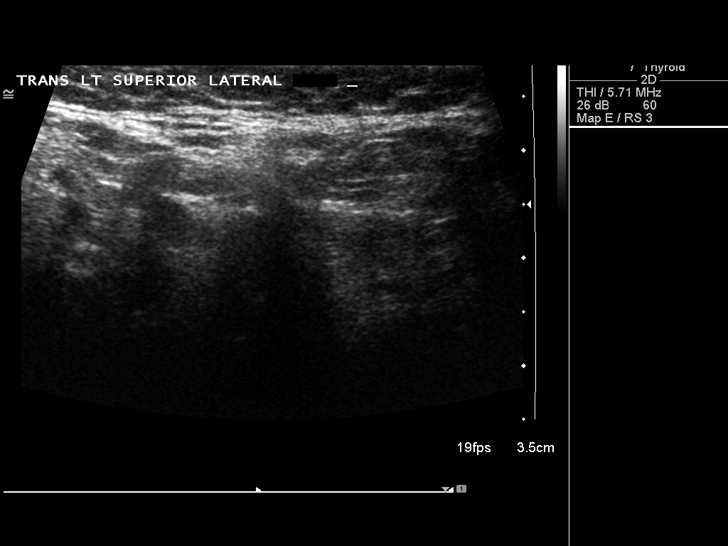

[14 of 25 positions shown; findings below may reference images not displayed]

FINDINGS: Right thyroid lobe:  5.7 x 2.6 x 2.3 cm.
Left thyroid lobe:  4.8 x 2.2 x 1.7 cm.
Isthmus:  1.5 mm in thickness.

Focal nodules:  The echogenicity of the thyroid parenchyma is
inhomogeneous. Three solid nodules are present bilaterally.  The
largest nodule occupies much of the left lobe of thyroid, measuring
3.7 x 2.1 x 1.6 cm.

A complex nodule which is primarily solid within the upper pole of
the right lobe of thyroid measures 3.2 x 1.7 x 2.1 cm.  A solid
nodule in the lower pole on the right measures 2.2 x 2.2 x 2.0 cm.
Findings meet consensus criteria for biopsy.  Ultrasound-guided
fine needle aspiration should be considered, as per the consensus
statement: Management of Thyroid Nodules Detected at US:  Society
of Radiologists in Ultrasound Consensus Conference Statement.

Lymphadenopathy:  None visualized.
IMPRESSION: Three solid nodules two on the right and one on the left as
described above.  All of these nodules meet criteria for
percutaneous biopsy.

## 2015-01-01 IMAGING — US US THYROID BIOPSY
1 series · 13 of 21 positions shown · non-contrast
Comparison: none

CLINICAL DATA: Multiple large thyroid nodules.

ULTRASOUND-GUIDED THYROID ASPIRATION BIOPSY INFERIOR RIGHT
TECHNIQUE: The procedure, risks (including but not limited to
bleeding, infection, organ damage), benefits, and alternatives were
explained to the patient.  Questions regarding the procedure were
encouraged and answered.  The patient understands and consents to
the procedure. Survey ultrasound was performed and the dominant
lesions localized.  Skin was marked, then prepped with Betadine,
draped in usual sterile fashion, and infiltrated locally with 1%
lidocaine.  Under ultrasound, the dominant lesion in the inferior
right lobe was localized.  An appropriate skin entry site was
determined.  Under real-time ultrasound guidance, 3  passes were
made into the lesion with 25 gauge needles.
IMPRESSION
1.  Technically successful ultrasound-guided thyroid aspiration
biopsy inferior right lesion
.
ULTRASOUND-GUIDED THYROID ASPIRATION BIOPSY MID RIGHT
TECHNIQUE: Under ultrasound the dominant complex lesion in the the
upper/mid right lobe was localized.   Under real-time ultrasound
guidance, 3  passes were made into the lesion with 25 gauge
needles.
biopsy, complex upper/mid right lesion.
ULTRASOUND-GUIDED THYROID ASPIRATION BIOPSY, LEFT
TECHNIQUE: Under ultrasound the dominant lesion in the inferior
left lobe was localized.   Under real-time ultrasound guidance, 3
passes were made into the lesion with 25 gauge needles.  The
patient tolerated procedure well, with no immediate complications.
biopsy, inferior left lesion

[Series 1: us thyroid biopsy · 0.07mm/px · 21 acquisitions, 13 frames shown]
[im 1/21]
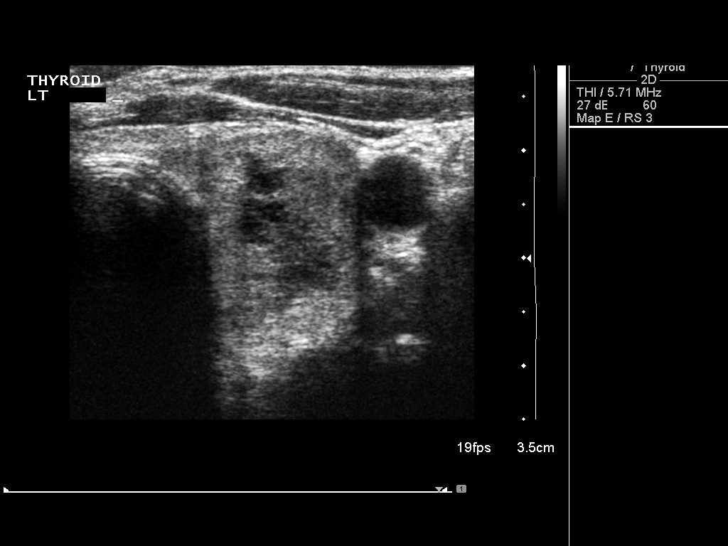
[im 3/21]
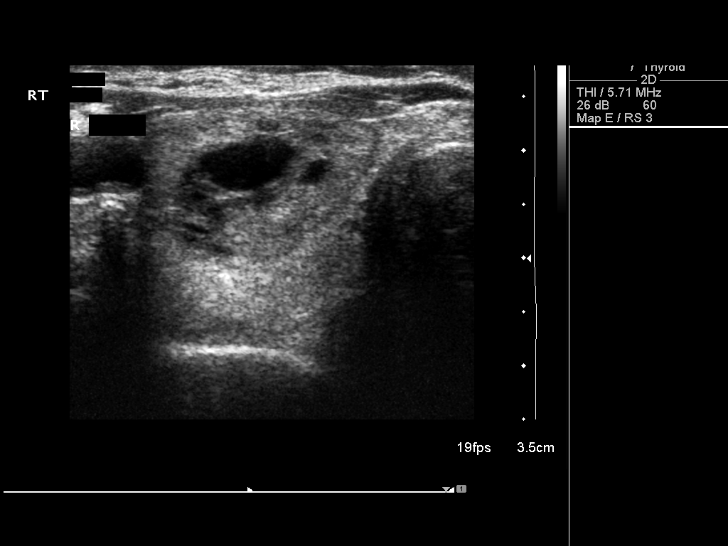
[im 5/21]
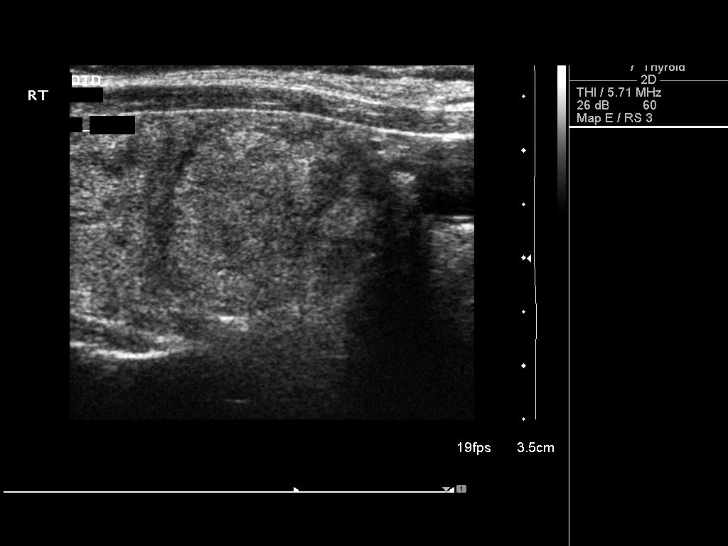
[im 6/21]
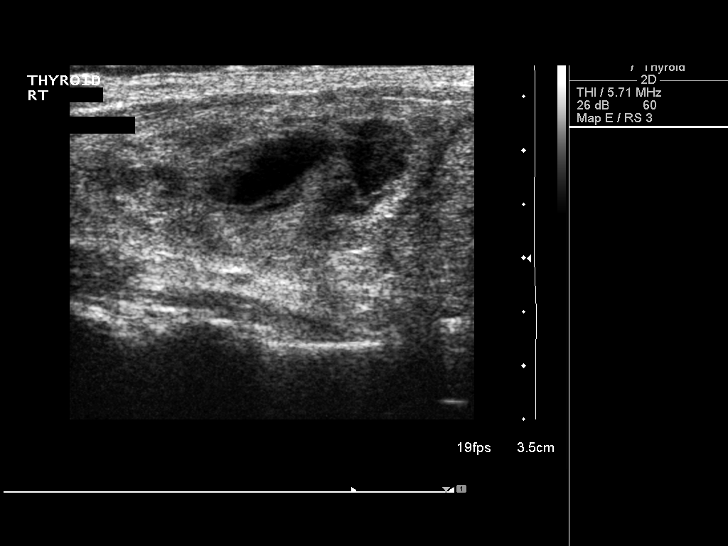
[im 8/21]
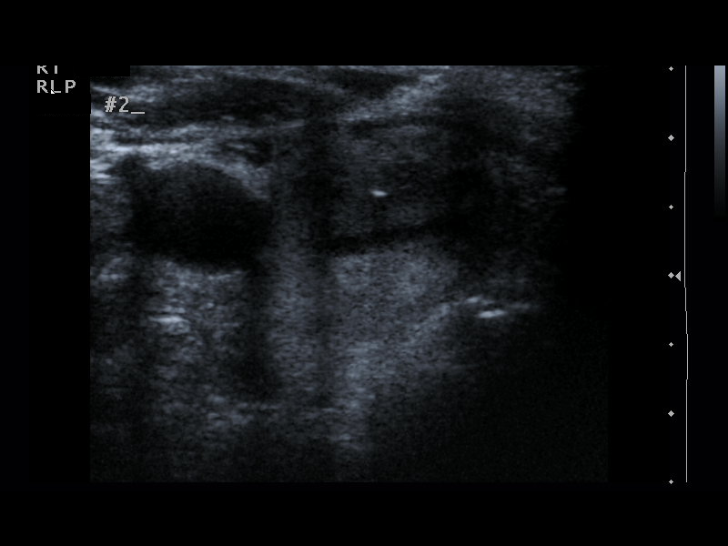
[im 9/21]
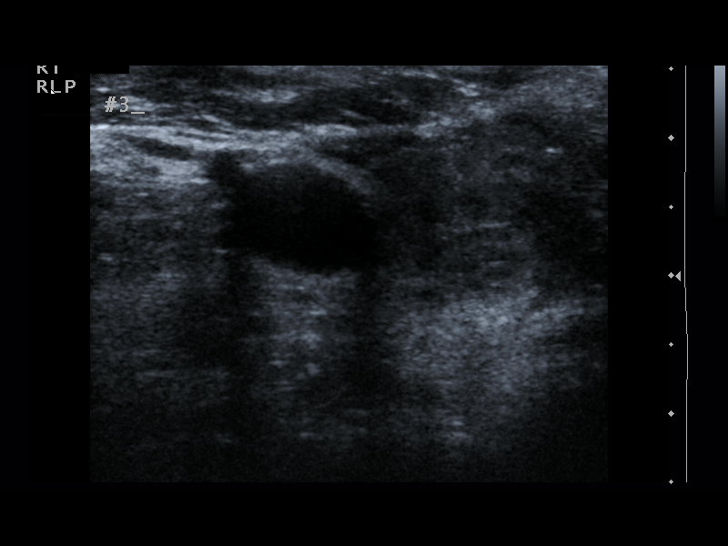
[im 11/21]
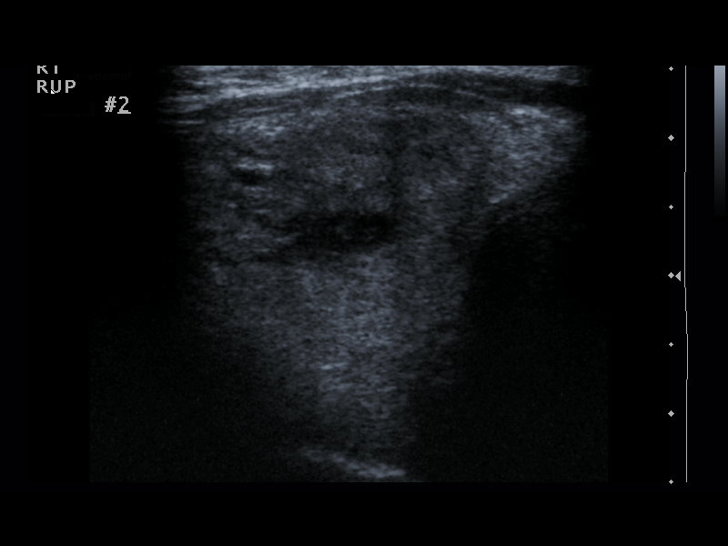
[im 13/21]
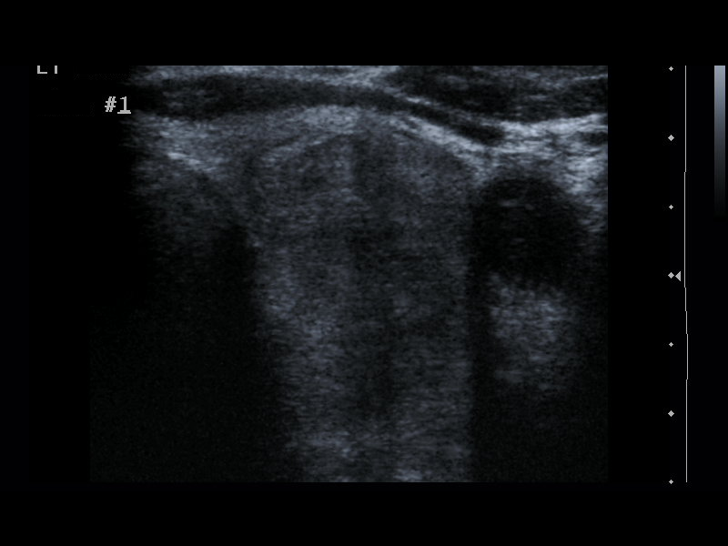
[im 14/21]
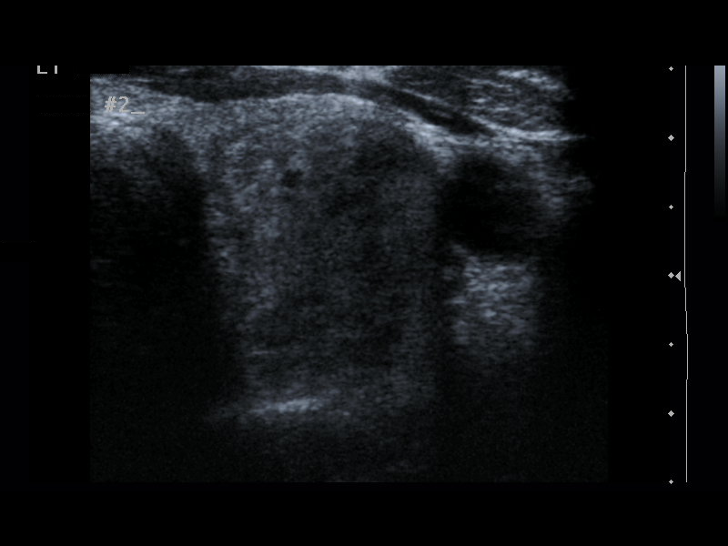
[im 16/21]
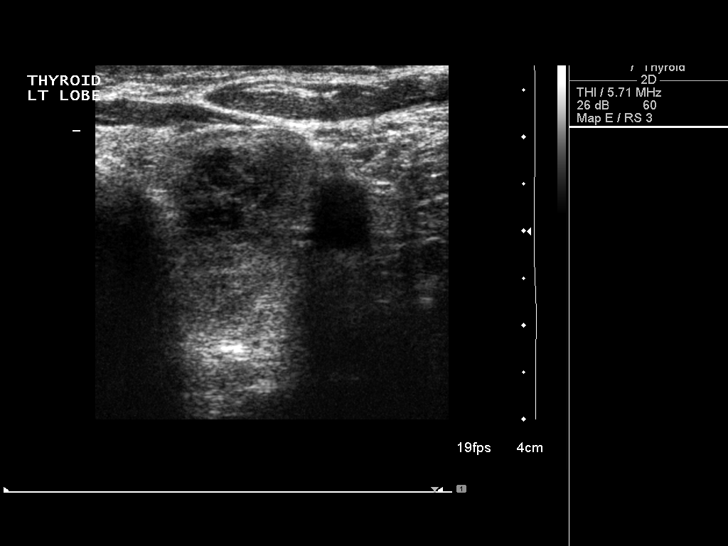
[im 17/21]
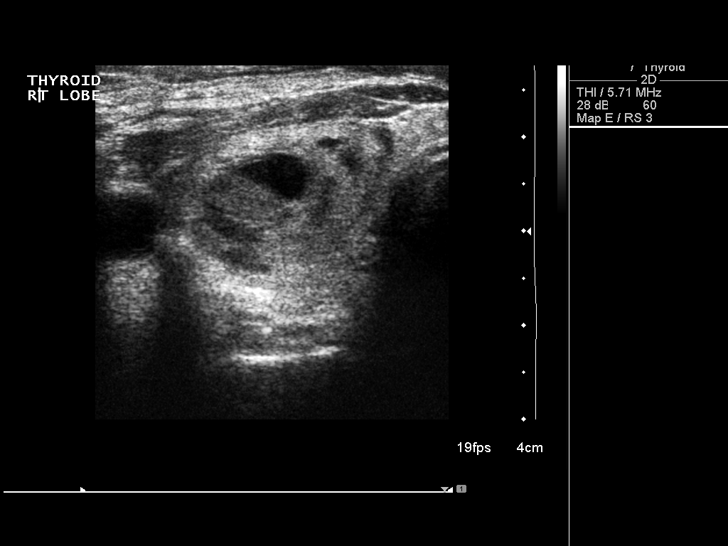
[im 19/21]
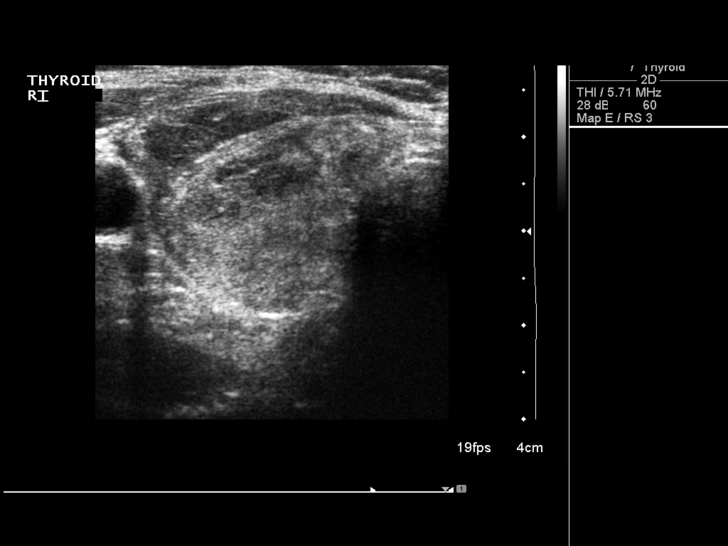
[im 21/21]
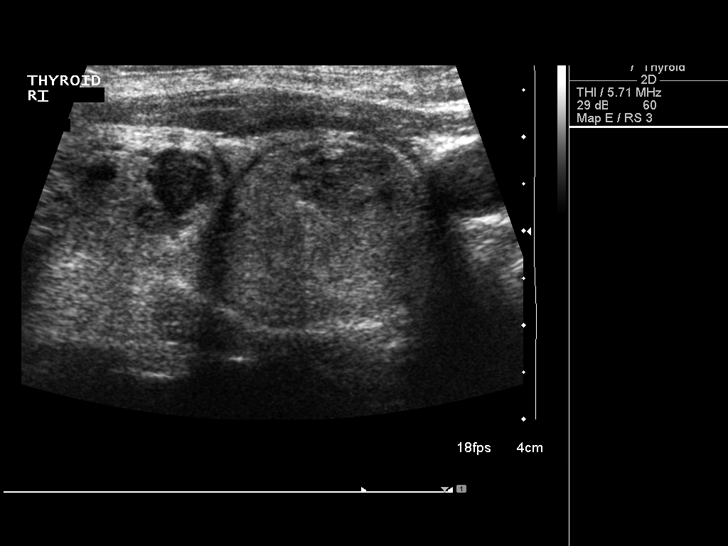

[13 of 21 positions shown; findings below may reference images not displayed]

## 2015-12-30 IMAGING — US US SOFT TISSUE HEAD/NECK
1 series · 14 of 25 positions shown · non-contrast
Comparison: 07/02/2012 and earlier studies

CLINICAL DATA: Thyroid nodules. Previous FNA biopsy of inferior
right, upper mid right, and inferior left lesions 07/02/2012.

EXAM:
THYROID ULTRASOUND
TECHNIQUE: Ultrasound examination of the thyroid gland and adjacent soft
tissues was performed.

[Series 1: us soft tissue head/neck · 0.05mm/px · 14 of 54 slices shown]
[im 1/54]
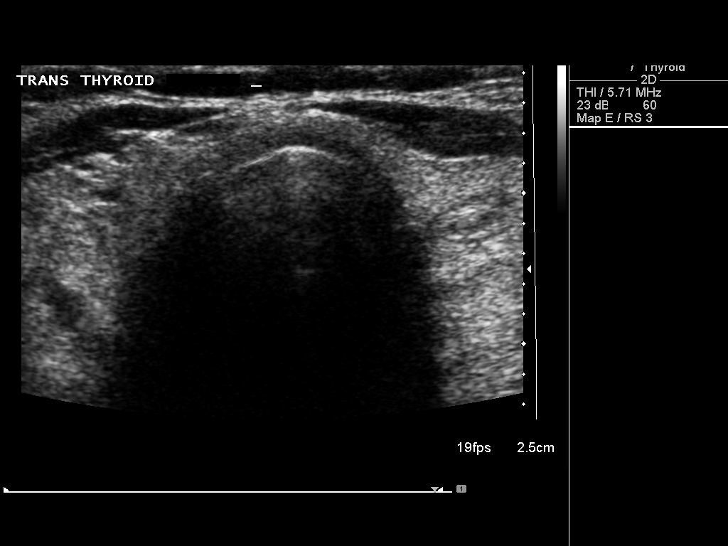
[im 5/54]
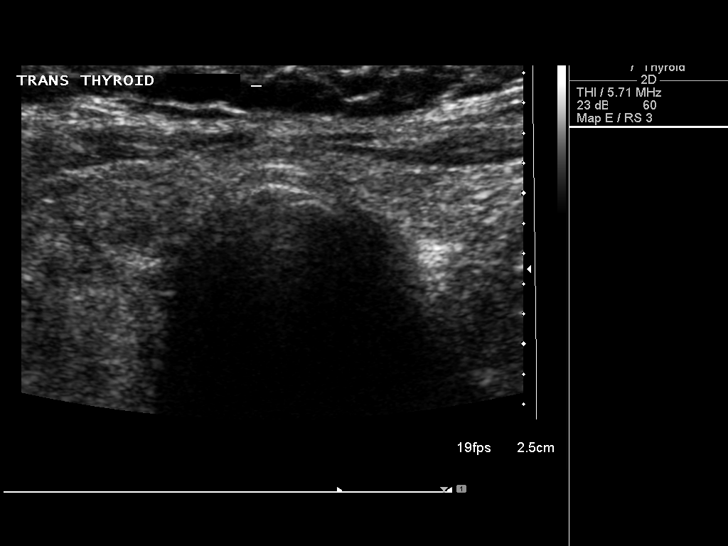
[im 9/54]
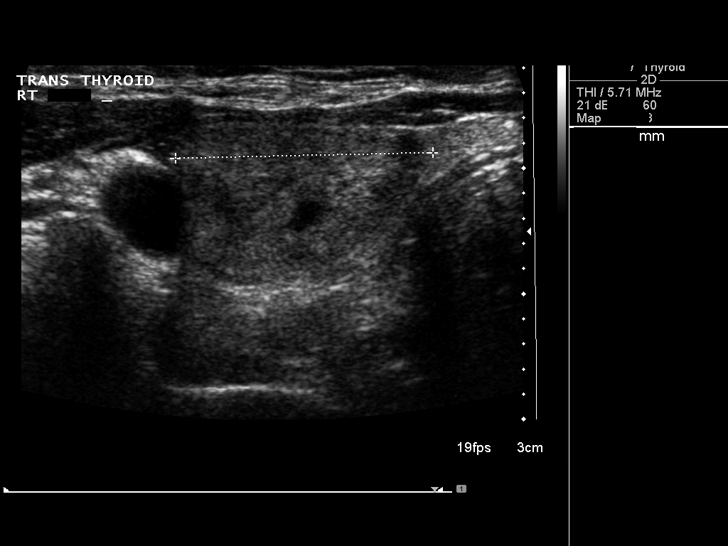
[im 14/54]
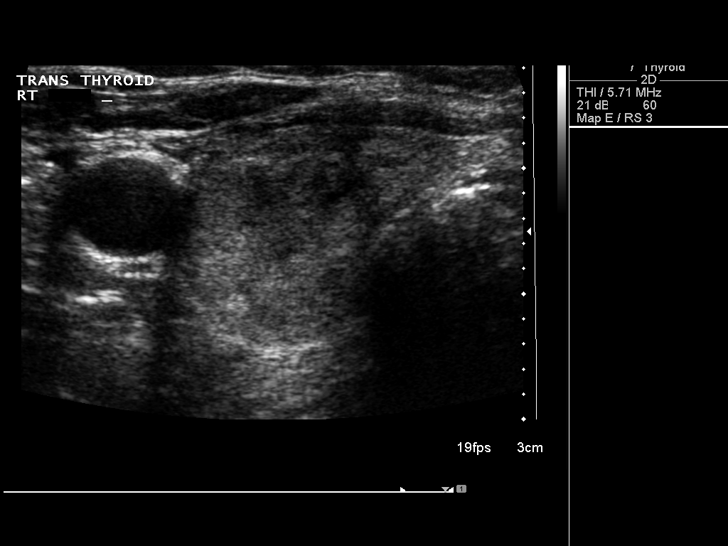
[im 18/54]
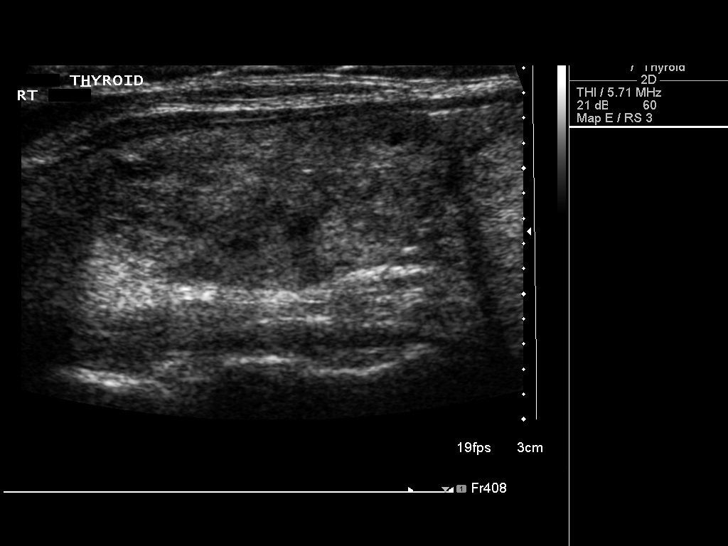
[im 20/54]
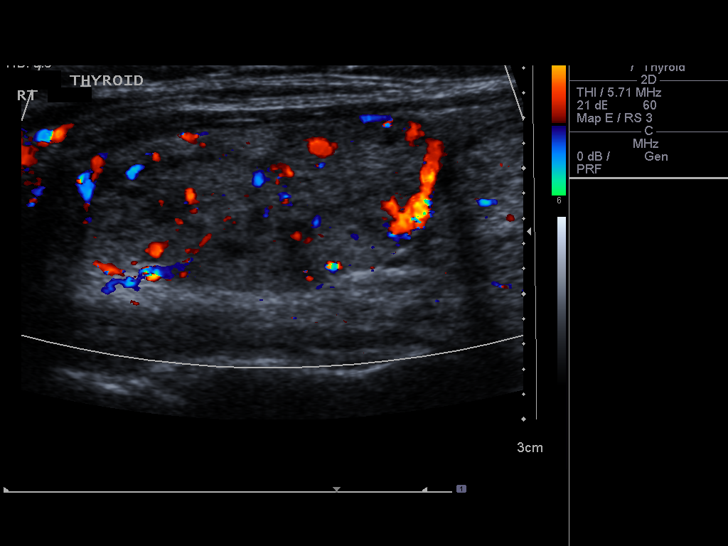
[im 25/54]
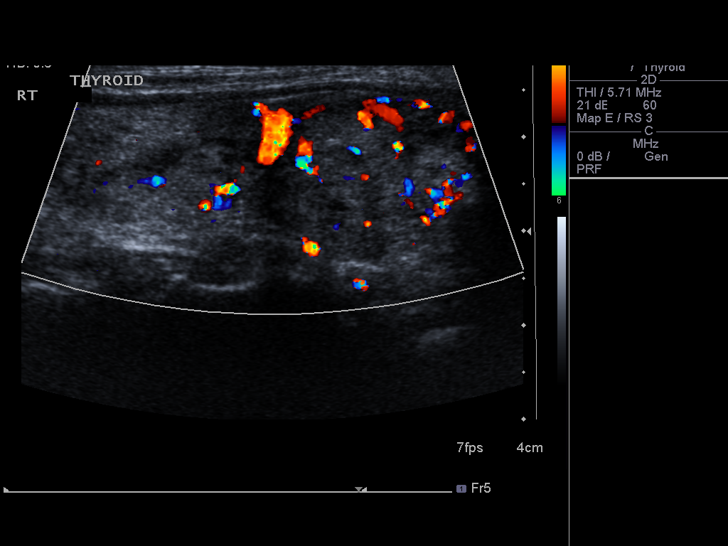
[im 29/54]
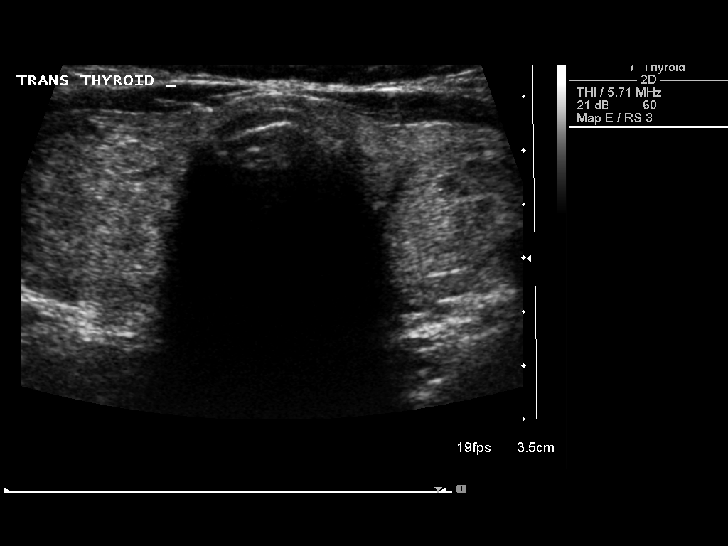
[im 34/54]
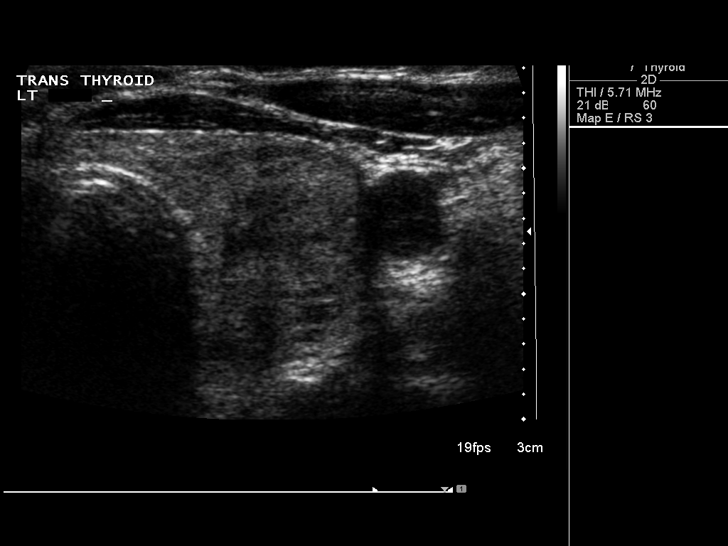
[im 36/54]
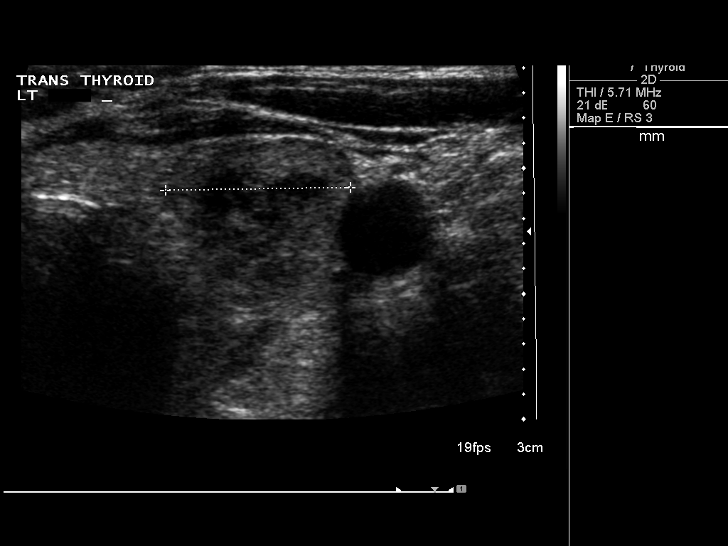
[im 40/54]
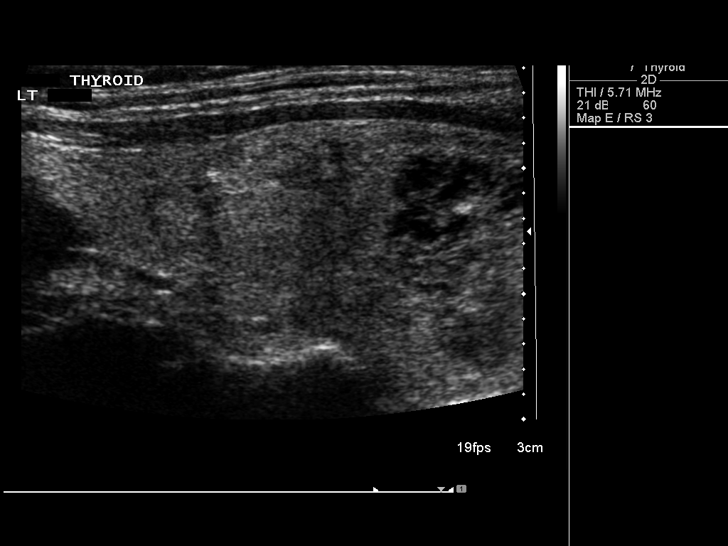
[im 45/54]
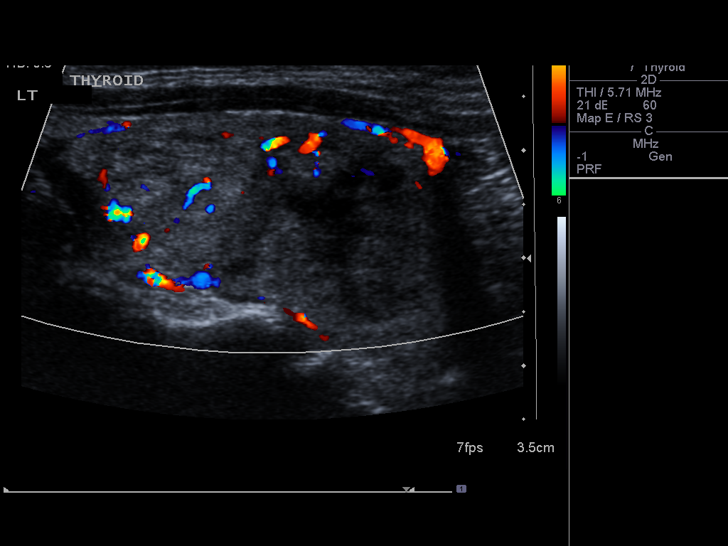
[im 49/54]
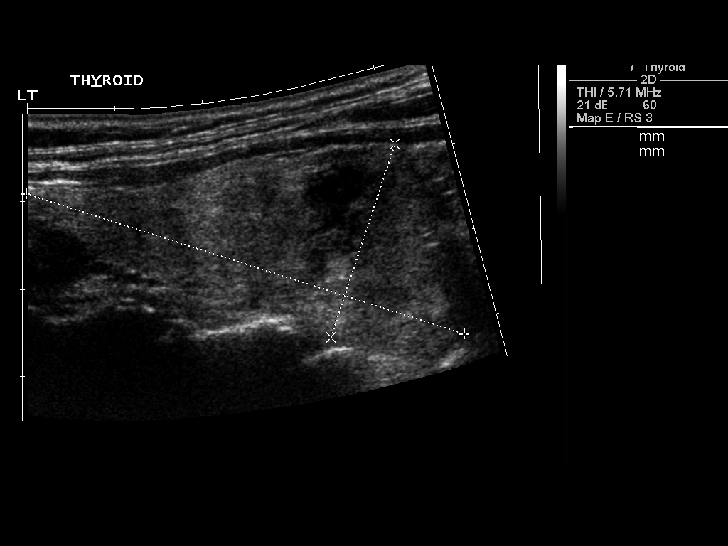
[im 54/54]
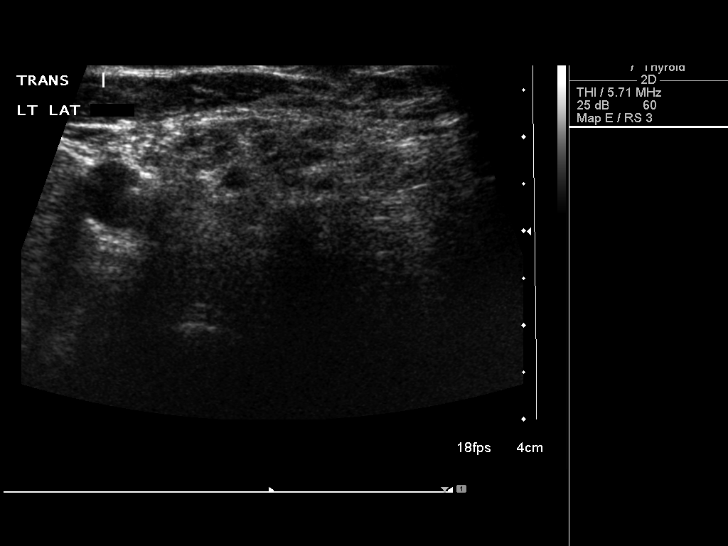

[14 of 25 positions shown; findings below may reference images not displayed]

FINDINGS: Right thyroid lobe

Measurements: 56 x 19 x 22 mm.  Two lesions:

30 x 40 x 21 mm, upper pole (previously 32 x 17 x21)

21 x 18 x 21 mm solid, lower pole (previously 22 x 22 x 20)

Left thyroid lobe

Measurements: 53 x 23 x 15 mm. Solitary central lesion 35 x 17 x 15
mm (previously 37 x 21 x 16).

Isthmus

Thickness: 1.6 mm.  No nodules visualized.

Lymphadenopathy

None visualized.
IMPRESSION: Thyromegaly with stable bilateral nodules as above. Correlate with
previous biopsy results.

## 2016-09-02 ENCOUNTER — Ambulatory Visit (INDEPENDENT_AMBULATORY_CARE_PROVIDER_SITE_OTHER): Payer: 59 | Admitting: Emergency Medicine

## 2016-09-02 ENCOUNTER — Encounter: Payer: Self-pay | Admitting: Emergency Medicine

## 2016-09-02 VITALS — BP 122/76 | HR 79 | Temp 98.8°F | Resp 16 | Ht 64.5 in | Wt 124.0 lb

## 2016-09-02 DIAGNOSIS — B028 Zoster with other complications: Secondary | ICD-10-CM | POA: Diagnosis not present

## 2016-09-02 DIAGNOSIS — B029 Zoster without complications: Secondary | ICD-10-CM | POA: Insufficient documentation

## 2016-09-02 MED ORDER — VALACYCLOVIR HCL 1 G PO TABS: 1000.0000 mg | ORAL_TABLET | Freq: Two times a day (BID) | ORAL | 0 refills | Status: AC

## 2016-09-02 MED ORDER — CEFADROXIL 500 MG PO CAPS: 500.0000 mg | ORAL_CAPSULE | Freq: Two times a day (BID) | ORAL | 0 refills | Status: AC

## 2016-09-02 NOTE — Progress Notes (Signed)
Jacqueline Jacqueline Harrison 81 y.o.   Chief Complaint  Patient presents with  . Rash    facial, x 4 days, some itch    HISTORY OF PRESENT ILLNESS: This is a 81 y.o. female complaining of scalp rash that started with blisters 4 days ago; had burning itchy sensation prior to rash.  Rash  This is a new problem. The current episode started in the past 7 days. The problem has been gradually worsening since onset. The affected locations include the face and head. The rash is characterized by blistering and redness. She was exposed to nothing. Pertinent negatives include no congestion, cough, diarrhea, eye pain, facial edema, fever, shortness of breath, sore throat or vomiting. Past treatments include nothing.     Prior to Admission medications   Medication Sig Start Date End Date Taking? Authorizing Provider  ALPRAZolam Prudy Feeler(XANAX) 1 MG tablet Take 1 mg by mouth at bedtime.    Yes Historical Provider, MD  CALCIUM PO Take 1 tablet by mouth 2 (two) times daily. + Magnesium + Zinc   Yes Historical Provider, MD  Carboxymethylcellulose Sodium (REFRESH PLUS OP) Apply 1-2 drops to eye daily.   Yes Historical Provider, MD  Cholecalciferol (VITAMIN D PO) Take 1 tablet by mouth every morning.    Yes Historical Provider, MD  fluticasone (FLONASE) 50 MCG/ACT nasal spray Place 1-2 sprays into both nostrils daily as needed for allergies or rhinitis.   Yes Historical Provider, MD  Multiple Vitamin (MULTIVITAMIN WITH MINERALS) TABS tablet Take 1 tablet by mouth every morning.   Yes Historical Provider, MD  naproxen sodium (ANAPROX) 220 MG tablet Take 220-440 mg by mouth daily as needed (pain).   Yes Historical Provider, MD  Omega-3 Fatty Acids (FISH OIL PO) Take 1 capsule by mouth every morning.    Yes Historical Provider, MD  cefadroxil (DURICEF) 500 MG capsule Take 1 capsule (500 mg total) by mouth 2 (two) times daily. 09/02/16 09/09/16  Jacqueline QuintMiguel Jose Maddisen Vought, MD  polyvinyl alcohol (LIQUIFILM TEARS) 1.4 % ophthalmic solution  Place 1-2 drops into both eyes daily.    Historical Provider, MD  valACYclovir (VALTREX) 1000 MG tablet Take 1 tablet (1,000 mg total) by mouth 2 (two) times daily. 09/02/16 09/09/16  Jacqueline QuintMiguel Jose Reonna Finlayson, MD    Allergies  Allergen Reactions  . Peanut-Containing Drug Products Nausea And Vomiting    Patient Active Problem List   Diagnosis Date Noted  . Shingles 09/02/2016    Past Medical History:  Diagnosis Date  . Allergy   . Anxiety   . Complication of anesthesia    "was told has a small mouth"  . Difficult intubation    Potential for difficult intubation if small tube not used" per patient -had issues with this Cholecystectomy 2009-[facility].  Marland Kitchen. GERD (gastroesophageal reflux disease)   . Thyroid disease    followed for 2 yrs "thyroid nodules" "stable"    Past Surgical History:  Procedure Laterality Date  . CATARACT EXTRACTION, BILATERAL Bilateral   . CHOLECYSTECTOMY    . COLONOSCOPY WITH PROPOFOL N/A 12/22/2014   Procedure: COLONOSCOPY WITH PROPOFOL;  Surgeon: Charna ElizabethJyothi Mann, MD;  Location: [facility];  Service: Endoscopy;  Laterality: N/A;  . DIAGNOSTIC LAPAROSCOPY     diagnostic- negative findings -to evaluate ovaries.  Marland Kitchen. EYE SURGERY    . TONSILLECTOMY      Social History   Social History  . Marital status: Married    Spouse name: N/A  . Number of children: N/A  . Years of education: N/A  Occupational History  . Not on file.   Social History Main Topics  . Smoking status: Never Smoker  . Smokeless tobacco: Not on file  . Alcohol use Yes     Comment: wine -occ.  . Drug use: No  . Sexual activity: Not on file   Other Topics Concern  . Not on file   Social History Narrative  . No narrative on file    Family History  Problem Relation Age of Onset  . Cancer Mother   . Hypertension Mother   . Cancer Father   . Mental illness Father   . Heart disease Father   . Heart disease Maternal Grandmother   . Hyperlipidemia Maternal Grandmother   . Cancer Maternal  Grandfather   . Mental illness Maternal Grandfather   . Heart disease Paternal Grandfather   . Cancer Sister   . Hypertension Sister   . Mental illness Sister   . Cancer Brother      Review of Systems  Constitutional: Negative for chills, fever, malaise/fatigue and weight loss.  HENT: Negative for congestion, ear pain, nosebleeds, sinus pain and sore throat.   Eyes: Negative for blurred vision, double vision, pain, discharge and redness.  Respiratory: Negative for cough, hemoptysis, shortness of breath and wheezing.   Cardiovascular: Negative for chest pain, palpitations and leg swelling.  Gastrointestinal: Negative for abdominal pain, diarrhea, nausea and vomiting.  Genitourinary: Negative for dysuria and hematuria.  Musculoskeletal: Negative for myalgias and neck pain.  Skin: Positive for itching and rash.  Neurological: Negative for dizziness, sensory change, focal weakness and headaches.  Endo/Heme/Allergies: Negative.   All other systems reviewed and are negative.   Vitals:   09/02/16 1055  BP: 122/76  Pulse: 79  Resp: 16  Temp: 98.8 F (37.1 C)    Physical Exam  Constitutional: She is oriented to person, place, and time. She appears well-developed and well-nourished.  HENT:  Head: Normocephalic and atraumatic.  Right Ear: External ear normal.  Left Ear: External ear normal.  Nose: Nose normal.  Mouth/Throat: Oropharynx is clear and moist. No oropharyngeal exudate.  Eyes: Conjunctivae and EOM are normal. Pupils are equal, round, and reactive to light.  Neck: Normal range of motion. Neck supple. No JVD present. No thyromegaly present.  Cardiovascular: Normal rate, regular rhythm and normal heart sounds.   Pulmonary/Chest: Effort normal and breath sounds normal.  Abdominal: Soft.  Musculoskeletal: Normal range of motion.  Lymphadenopathy:    She has no cervical adenopathy.  Neurological: She is alert and oriented to person, place, and time. No cranial nerve  deficit or sensory deficit. She exhibits normal muscle tone. Coordination normal.  Skin: Skin is warm. Capillary refill takes less than 2 seconds. Rash noted.  Left frontal scalp: vesicular rash with erythematous base; erythema extending into forehead; no eye involvement or nose rash.  Psychiatric: She has a normal mood and affect. Her behavior is normal.  Vitals reviewed.    ASSESSMENT & PLAN: Jacqueline Harrison was seen today for rash.  Diagnoses and all orders for this visit:  Herpes zoster with other complication Comments: suspected cellulitis   Other orders -     valACYclovir (VALTREX) 1000 MG tablet; Take 1 tablet (1,000 mg total) by mouth 2 (two) times daily. -     cefadroxil (DURICEF) 500 MG capsule; Take 1 capsule (500 mg total) by mouth 2 (two) times daily.    Patient Instructions       IF you received an x-ray today, you will receive an  invoice from [facility]. Please contact [facility] at [phone] with questions or concerns regarding your invoice.   IF you received labwork today, you will receive an invoice from [facility]. Please contact [facility] at [phone] with questions or concerns regarding your invoice.   Our billing staff will not be able to assist you with questions regarding bills from these companies.  You will be contacted with the lab results as soon as they are available. The fastest way to get your results is to activate your My Chart account. Instructions are located on the last page of this paperwork. If you have not heard from [place] regarding the results in 2 weeks, please contact this office.      Shingles Shingles is an infection that causes a painful skin rash and fluid-filled blisters. Shingles is caused by the same virus that causes chickenpox. Shingles only develops in people who:  Have had chickenpox.  Have gotten the chickenpox vaccine. (This is rare.) The first symptoms of shingles may be itching, tingling, or pain in an  area on your skin. A rash will follow in a few days or weeks. The rash is usually on one side of the body in a bandlike or beltlike pattern. Over time, the rash turns into fluid-filled blisters that break open, scab over, and dry up. Medicines may:  Help you manage pain.  Help you recover more quickly.  Help to prevent long-term problems. Follow these instructions at home: Medicines   Take medicines only as told by your doctor.  Apply an anti-itch or numbing cream to the affected area as told by your doctor. Blister and Rash Care   Take a cool bath or put cool compresses on the area of the rash or blisters as told by your doctor. This may help with pain and itching.  Keep your rash covered with a loose bandage (dressing). Wear loose-fitting clothing.  Keep your rash and blisters clean with mild soap and cool water or as told by your doctor.  Check your rash every day for signs of infection. These include redness, swelling, and pain that lasts or gets worse.  Do not pick your blisters.  Do not scratch your rash. General instructions   Rest as told by your doctor.  Keep all follow-up visits as told by your doctor. This is important.  Until your blisters scab over, your infection can cause chickenpox in people who have never had it or been vaccinated against it. To prevent this from happening, avoid touching other people or being around other people, especially:  Babies.  Pregnant women.  Children who have eczema.  Elderly people who have transplants.  People who have chronic illnesses, such as leukemia or AIDS. Contact a doctor if:  Your pain does not get better with medicine.  Your pain does not get better after the rash heals.  Your rash looks infected. Signs of infection include:  Redness.  Swelling.  Pain that lasts or gets worse. Get help right away if:  The rash is on your face or nose.  You have pain in your face, pain around your eye area, or loss of  feeling on one side of your face.  You have ear pain or you have ringing in your ear.  You have loss of taste.  Your condition gets worse. This information is not intended to replace advice given to you by your health care provider. Make sure you discuss any questions you have with your health care provider. Document Released: 11/15/2007  Document Revised: 01/23/2016 Document Reviewed: 03/10/2014 Elsevier Interactive Patient Education  2017 Elsevier Inc.      Edwina Barth, MD [facility]

## 2016-09-02 NOTE — Patient Instructions (Addendum)
   IF you received an x-ray today, you will receive an invoice from [facility]. Please contact  Radiology at [phone] with questions or concerns regarding your invoice.   IF you received labwork today, you will receive an invoice from [facility]. Please contact [facility] at [phone] with questions or concerns regarding your invoice.   Our billing staff will not be able to assist you with questions regarding bills from these companies.  You will be contacted with the lab results as soon as they are available. The fastest way to get your results is to activate your My Chart account. Instructions are located on the last page of this paperwork. If you have not heard from us regarding the results in 2 weeks, please contact this office.      Shingles Shingles is an infection that causes a painful skin rash and fluid-filled blisters. Shingles is caused by the same virus that causes chickenpox. Shingles only develops in people who:  Have had chickenpox.  Have gotten the chickenpox vaccine. (This is rare.) The first symptoms of shingles may be itching, tingling, or pain in an area on your skin. A rash will follow in a few days or weeks. The rash is usually on one side of the body in a bandlike or beltlike pattern. Over time, the rash turns into fluid-filled blisters that break open, scab over, and dry up. Medicines may:  Help you manage pain.  Help you recover more quickly.  Help to prevent long-term problems. Follow these instructions at home: Medicines   Take medicines only as told by your doctor.  Apply an anti-itch or numbing cream to the affected area as told by your doctor. Blister and Rash Care   Take a cool bath or put cool compresses on the area of the rash or blisters as told by your doctor. This may help with pain and itching.  Keep your rash covered with a loose bandage (dressing). Wear loose-fitting clothing.  Keep your rash and blisters clean  with mild soap and cool water or as told by your doctor.  Check your rash every day for signs of infection. These include redness, swelling, and pain that lasts or gets worse.  Do not pick your blisters.  Do not scratch your rash. General instructions   Rest as told by your doctor.  Keep all follow-up visits as told by your doctor. This is important.  Until your blisters scab over, your infection can cause chickenpox in people who have never had it or been vaccinated against it. To prevent this from happening, avoid touching other people or being around other people, especially:  Babies.  Pregnant women.  Children who have eczema.  Elderly people who have transplants.  People who have chronic illnesses, such as leukemia or AIDS. Contact a doctor if:  Your pain does not get better with medicine.  Your pain does not get better after the rash heals.  Your rash looks infected. Signs of infection include:  Redness.  Swelling.  Pain that lasts or gets worse. Get help right away if:  The rash is on your face or nose.  You have pain in your face, pain around your eye area, or loss of feeling on one side of your face.  You have ear pain or you have ringing in your ear.  You have loss of taste.  Your condition gets worse. This information is not intended to replace advice given to you by your health care provider. Make sure you discuss   any questions you have with your health care provider. Document Released: 11/15/2007 Document Revised: 01/23/2016 Document Reviewed: 03/10/2014 Elsevier Interactive Patient Education  2017 Elsevier Inc.  

## 2017-12-28 ENCOUNTER — Other Ambulatory Visit: Payer: Self-pay | Admitting: Gastroenterology

## 2018-01-31 ENCOUNTER — Ambulatory Visit (HOSPITAL_COMMUNITY): Payer: Medicare Other | Admitting: Anesthesiology

## 2018-01-31 ENCOUNTER — Ambulatory Visit (HOSPITAL_COMMUNITY)
Admission: RE | Admit: 2018-01-31 | Discharge: 2018-01-31 | Disposition: A | Discharge status: Home / Self Care | Payer: Medicare Other | Source: Ambulatory Visit | Attending: Gastroenterology | Admitting: Gastroenterology

## 2018-01-31 ENCOUNTER — Encounter (HOSPITAL_COMMUNITY)
Admission: RE | Disposition: A | Discharge status: Home / Self Care | Payer: Self-pay | Source: Ambulatory Visit | Attending: Gastroenterology

## 2018-01-31 ENCOUNTER — Other Ambulatory Visit: Payer: Self-pay

## 2018-01-31 ENCOUNTER — Encounter (HOSPITAL_COMMUNITY): Payer: Self-pay | Admitting: *Deleted

## 2018-01-31 DIAGNOSIS — Z7951 Long term (current) use of inhaled steroids: Secondary | ICD-10-CM | POA: Diagnosis not present

## 2018-01-31 DIAGNOSIS — Z79899 Other long term (current) drug therapy: Secondary | ICD-10-CM | POA: Diagnosis not present

## 2018-01-31 DIAGNOSIS — Z86010 Personal history of colonic polyps: Secondary | ICD-10-CM | POA: Diagnosis not present

## 2018-01-31 DIAGNOSIS — Z1211 Encounter for screening for malignant neoplasm of colon: Secondary | ICD-10-CM | POA: Diagnosis present

## 2018-01-31 DIAGNOSIS — K648 Other hemorrhoids: Secondary | ICD-10-CM | POA: Diagnosis not present

## 2018-01-31 DIAGNOSIS — Z8249 Family history of ischemic heart disease and other diseases of the circulatory system: Secondary | ICD-10-CM | POA: Diagnosis not present

## 2018-01-31 DIAGNOSIS — Z800 Family history of malignant neoplasm of digestive organs: Secondary | ICD-10-CM | POA: Insufficient documentation

## 2018-01-31 DIAGNOSIS — K5730 Diverticulosis of large intestine without perforation or abscess without bleeding: Secondary | ICD-10-CM | POA: Insufficient documentation

## 2018-01-31 DIAGNOSIS — Z91010 Allergy to peanuts: Secondary | ICD-10-CM | POA: Insufficient documentation

## 2018-01-31 DIAGNOSIS — K219 Gastro-esophageal reflux disease without esophagitis: Secondary | ICD-10-CM | POA: Insufficient documentation

## 2018-01-31 DIAGNOSIS — Z7982 Long term (current) use of aspirin: Secondary | ICD-10-CM | POA: Insufficient documentation

## 2018-01-31 HISTORY — PX: COLONOSCOPY WITH PROPOFOL: SHX5780

## 2018-01-31 SURGERY — COLONOSCOPY WITH PROPOFOL
Anesthesia: Monitor Anesthesia Care

## 2018-01-31 MED ORDER — PROPOFOL 500 MG/50ML IV EMUL
INTRAVENOUS | Status: DC | PRN
  Administered 2018-01-31: 120 ug/kg/min via INTRAVENOUS

## 2018-01-31 MED ORDER — SODIUM CHLORIDE 0.9 % IV SOLN: INTRAVENOUS | Status: DC

## 2018-01-31 MED ORDER — PROPOFOL 10 MG/ML IV BOLUS
INTRAVENOUS | Status: AC
  Filled 2018-01-31: qty 60

## 2018-01-31 MED ORDER — LACTATED RINGERS IV SOLN
INTRAVENOUS | Status: DC
  Administered 2018-01-31: 07:00:00 via INTRAVENOUS

## 2018-01-31 MED ORDER — LIDOCAINE 2% (20 MG/ML) 5 ML SYRINGE
INTRAMUSCULAR | Status: DC | PRN
  Administered 2018-01-31: 100 mg via INTRAVENOUS

## 2018-01-31 MED ORDER — PROPOFOL 10 MG/ML IV BOLUS
INTRAVENOUS | Status: DC | PRN
  Administered 2018-01-31 (×4): 20 mg via INTRAVENOUS

## 2018-01-31 SURGICAL SUPPLY — 21 items

## 2018-01-31 NOTE — Transfer of Care (Signed)
Immediate Anesthesia Transfer of Care Note  Patient: ANGLES TREVIZO  Procedure(s) Performed: COLONOSCOPY WITH PROPOFOL (N/A )  Patient Location: Endoscopy Unit  Anesthesia Type:MAC  Level of Consciousness: awake and alert   Airway & Oxygen Therapy: Patient Spontanous Breathing  Post-op Assessment: Report given to RN and Post -op Vital signs reviewed and stable  Post vital signs: Reviewed and stable  Last Vitals:  Vitals Value Taken Time  BP    Temp    Pulse 63 01/31/2018  7:42 AM  Resp    SpO2 100 % 01/31/2018  7:42 AM  Vitals shown include unvalidated device data.  Last Pain:  Vitals:   01/31/18 0625  TempSrc: Oral  PainSc: 0-No pain         Complications: No apparent anesthesia complications

## 2018-01-31 NOTE — H&P (Signed)
Jacqueline Harrison is an 82 y.o. female.   Chief Complaint: Colorectal cancer screening. HPI: 82 year old white female here for colorectal cancer screening. She has a dfamily history of colon cancer in her father and mother, both of whom were diagnmosed I their 38's. See office notes for details.   Past Medical History:  Diagnosis Date  . Allergy   . Anxiety   . Difficult intubation    Potential for difficult intubation if small tube not used" per patient -had issues with this Cholecystectomy 2009-[facility].  Marland Kitchen GERD (gastroesophageal reflux disease)   . Thyroid disease    followed for 2 yrs "thyroid nodules" "stable"   Past Surgical History:  Procedure Laterality Date  . CATARACT EXTRACTION, BILATERAL Bilateral   . CHOLECYSTECTOMY    . COLONOSCOPY WITH PROPOFOL N/A 12/22/2014   Procedure: COLONOSCOPY WITH PROPOFOL;  Surgeon: Charna Elizabeth, MD;  Location: [facility];  Service: Endoscopy;  Laterality: N/A;  . DIAGNOSTIC LAPAROSCOPY     diagnostic- negative findings -to evaluate ovaries.  Marland Kitchen EYE SURGERY    . TONSILLECTOMY     Family History  Problem Relation Age of Onset  . Cancer Mother   . Hypertension Mother   . Cancer Father   . Mental illness Father   . Heart disease Father   . Heart disease Maternal Grandmother   . Hyperlipidemia Maternal Grandmother   . Cancer Maternal Grandfather   . Mental illness Maternal Grandfather   . Heart disease Paternal Grandfather   . Cancer Sister   . Hypertension Sister   . Mental illness Sister   .  Cancer of the tongue Brother    Social History:  reports that she has never smoked. She has never used smokeless tobacco. She reports that she drinks alcohol. She reports that she does not use drugs.  Allergies:  Allergies  Allergen Reactions  . Peanut-Containing Drug Products Nausea And Vomiting   Medications Prior to Admission  Medication Sig Dispense Refill  . aspirin 81 MG tablet Take 81 mg by mouth daily as needed for pain.    Marland Kitchen BLACK  PEPPER-TURMERIC PO Take 5 mLs by mouth daily.    . camphor-menthol (SARNA) lotion Apply 1 application topically as needed for itching.    . Carboxymethylcellulose Sodium (REFRESH PLUS OP) Apply 1-2 drops to eye 2 (two) times daily.     . cholecalciferol (VITAMIN D) 1000 units tablet Take 1 tablet by mouth every morning.     . fluticasone (FLONASE) 50 MCG/ACT nasal spray Place 1-2 sprays into both nostrils daily.     . Multiple Vitamin (MULTIVITAMIN WITH MINERALS) TABS tablet Take 1 tablet by mouth every morning.    . multivitamin-lutein (OCUVITE-LUTEIN) CAPS capsule Take 1 capsule by mouth daily.    . vitamin E 400 UNIT capsule Take 400 Units by mouth daily.    Review of Systems  Constitutional: Negative.   HENT: Negative.   Eyes: Negative.   Respiratory: Negative.   Cardiovascular: Negative.   Gastrointestinal: Positive for heartburn.  Genitourinary: Negative.   Skin: Negative.   Neurological: Negative.   Endo/Heme/Allergies: Negative.   Psychiatric/Behavioral: The patient is nervous/anxious.    Blood pressure (!) 172/73, pulse 64, temperature 97.8 F (36.6 C), temperature source Oral, resp. rate 13, height 5' 4.5" (1.638 m), weight 51.3 kg, SpO2 100 %. Physical Exam  Constitutional: She is oriented to person, place, and time. She appears well-developed and well-nourished.  HENT:  Head: Normocephalic and atraumatic.  Eyes: Pupils are equal, round,  and reactive to light. Conjunctivae and EOM are normal.  Neck: Normal range of motion. Neck supple.  Cardiovascular: Normal rate and regular rhythm.  Respiratory: Effort normal and breath sounds normal.  GI: Soft. Bowel sounds are normal.  Musculoskeletal: Normal range of motion.  Neurological: She is alert and oriented to person, place, and time.  Skin: Skin is warm and dry.  Psychiatric: She has a normal mood and affect. Her behavior is normal. Judgment and thought content normal.    Assessment/Plan Colorectal cancer  screening/Family history of colon cancer/Personal history of adenomatous polyps/Diverticulosis-proceed with a colonoscopy at this time.  Jacqueline Etherington, MD 01/31/2018, 7:06 AM

## 2018-01-31 NOTE — Anesthesia Procedure Notes (Signed)
Date/Time: 01/31/2018 7:22 AM Performed by: Florene Routeeardon, Sarh Kirschenbaum L, CRNA Oxygen Delivery Method: Simple face mask

## 2018-01-31 NOTE — Anesthesia Postprocedure Evaluation (Signed)
Anesthesia Post Note  Patient: Jacqueline Harrison  Procedure(s) Performed: COLONOSCOPY WITH PROPOFOL (N/A )     Patient location during evaluation: PACU Anesthesia Type: MAC Level of consciousness: awake and alert Pain management: pain level controlled Vital Signs Assessment: post-procedure vital signs reviewed and stable Respiratory status: spontaneous breathing, nonlabored ventilation, respiratory function stable and patient connected to nasal cannula oxygen Cardiovascular status: stable and blood pressure returned to baseline Postop Assessment: no apparent nausea or vomiting Anesthetic complications: no    Last Vitals:  Vitals:   01/31/18 0743 01/31/18 0750  BP: (!) 152/55 (!) 167/56  Pulse: 63 60  Resp: 16 16  Temp: 36.5 C   SpO2: 100% 100%    Last Pain:  Vitals:   01/31/18 0755  TempSrc:   PainSc: 0-No pain                 Ketsia Linebaugh DAVID

## 2018-01-31 NOTE — Anesthesia Preprocedure Evaluation (Signed)
Anesthesia Evaluation  Patient identified by MRN, date of birth, ID band Patient awake    Reviewed: Allergy & Precautions, NPO status , Patient's Chart, lab work & pertinent test results  Airway Mallampati: II  TM Distance: >3 FB Neck ROM: Full    Dental   Pulmonary    Pulmonary exam normal        Cardiovascular Normal cardiovascular exam     Neuro/Psych Anxiety    GI/Hepatic GERD  Medicated and Controlled,  Endo/Other    Renal/GU      Musculoskeletal   Abdominal   Peds  Hematology   Anesthesia Other Findings   Reproductive/Obstetrics                             Anesthesia Physical Anesthesia Plan  ASA: II  Anesthesia Plan: MAC   Post-op Pain Management:    Induction: Intravenous  PONV Risk Score and Plan: 2  Airway Management Planned: Simple Face Mask  Additional Equipment:   Intra-op Plan:   Post-operative Plan: Extubation in OR  Informed Consent: I have reviewed the patients History and Physical, chart, labs and discussed the procedure including the risks, benefits and alternatives for the proposed anesthesia with the patient or authorized representative who has indicated his/her understanding and acceptance.     Plan Discussed with: CRNA and Surgeon  Anesthesia Plan Comments:         Anesthesia Quick Evaluation

## 2018-01-31 NOTE — Op Note (Signed)
[facility] Patient Name: Jacqueline Harrison Procedure Date: 01/31/2018 MRN: [id number] Attending MD: Juanita Craver , MD Date of Birth: Aug 03, 1935 CSN: [id number] Age: 82 Admit Type: Outpatient Procedure:                Screening colonoscopy. Indications:              CRC screening patient at increased risk: Family                            history of 1st-degree relative with colorectal                            cancer at age 20 years (or older) Providers:                Juanita Craver, MD, Vista Lawman, RN, Elspeth Cho                            Tech., Technician, Danley Danker, CRNA. Referring MD:             Haywood Pao, MD Medicines:                Monitored Anesthesia Care Complications:            No immediate complications. Estimated Blood Loss:     Estimated blood loss: none. Procedure:                Pre-anesthesia assessment: - Prior to the                            procedure, a history and physical was performed,                            and patient medications and allergies were                            reviewed. The patient's tolerance of previous                            anesthesia was also reviewed. The risks and                            benefits of the procedure and the sedation options                            and risks were discussed with the patient. All                            questions were answered, and informed consent was                            obtained. Prior anticoagulants: The patient has                            taken aspirin, last dose was 1 day prior to  procedure. ASA Grade assessment: II - A patient                            with mild systemic disease. After reviewing the                            risks and benefits, the patient was deemed in                            satisfactory condition to undergo the procedure.                            After obtaining informed consent, the  colonoscope                            was passed under direct vision. Throughout the                            procedure, the patient's blood pressure, pulse, and                            oxygen saturations were monitored continuously. The                            PCF-H190DL ([id number]) Olympus peds colonoscope was                            introduced through the anus and advanced to the the                            terminal ileum, with identification of the                            appendiceal orifice and IC valve. The colonoscopy                            was performed without difficulty. The patient                            tolerated the procedure well. The quality of the                            bowel preparation was adequate. The terminal ileum,                            the ileocecal valve, the appendiceal orifice and                            the rectum were photographed. The bowel preparation                            used was GoLYTELY. Scope In: 7:24:36 AM Scope Out: 7:38:54 AM Scope Withdrawal Time: 0 hours 7 minutes 58 seconds  Total  Procedure Duration: 0 hours 14 minutes 18 seconds  Findings:      A few small and large-mouthed diverticula were found in the sigmoid       colon; there was no evidence of diverticular bleeding.      The terminal ileum appeared normal.      Small internal hemorrhoids were noted on retroflexion. Impression:               - Mild diverticulosis in the sigmoid colon; there                            was no evidence of diverticular bleeding.                           - The examined portion of the ileum was normal.                           - Small internal hemorrhoids.                           - No specimens collected. Moderate Sedation:      MAC used. Recommendation:           - High fiber diet with augmented water consumption                            daily.                           - Continue present medications.                            - Repeat colonoscopy is not recommended for                            surveillance due to patient's age.                           - Return to GI office PRN.                           - If the patient has any abnormal GI symptoms in                            the interim, she has been advised to call the                            office ASAP for further recommendations. Procedure Code(s):        --- Professional ---                           [phone], Colonoscopy, flexible; diagnostic, including                            collection of specimen(s) by brushing or washing,  when performed (separate procedure) Diagnosis Code(s):        --- Professional ---                           K57.30, Diverticulosis of large intestine without                            perforation or abscess without bleeding                           Z80.0, Family history of malignant neoplasm of                            digestive organs                           Z12.11, Encounter for screening for malignant                            neoplasm of colon CPT copyright 2017 American [employer] Association. All rights reserved. The codes documented in this report are preliminary and upon coder review may  be revised to meet current compliance requirements. Juanita Craver, MD Juanita Craver, MD 01/31/2018 7:47:10 AM This report has been signed electronically. Number of Addenda: 0

## 2018-01-31 NOTE — Discharge Instructions (Signed)

## 2018-02-01 ENCOUNTER — Encounter (HOSPITAL_COMMUNITY): Payer: Self-pay | Admitting: Gastroenterology

## 2018-10-16 ENCOUNTER — Encounter (HOSPITAL_COMMUNITY): Payer: Self-pay | Admitting: Emergency Medicine

## 2018-10-16 ENCOUNTER — Ambulatory Visit (HOSPITAL_COMMUNITY)
Admission: EM | Admit: 2018-10-16 | Discharge: 2018-10-16 | Disposition: A | Discharge status: Home / Self Care | Payer: Medicare Other | Attending: Family Medicine | Admitting: Family Medicine

## 2018-10-16 ENCOUNTER — Other Ambulatory Visit: Payer: Self-pay

## 2018-10-16 DIAGNOSIS — S60511A Abrasion of right hand, initial encounter: Secondary | ICD-10-CM | POA: Diagnosis not present

## 2018-10-16 DIAGNOSIS — S80211A Abrasion, right knee, initial encounter: Secondary | ICD-10-CM

## 2018-10-16 DIAGNOSIS — S60512A Abrasion of left hand, initial encounter: Secondary | ICD-10-CM

## 2018-10-16 DIAGNOSIS — S01511A Laceration without foreign body of lip, initial encounter: Secondary | ICD-10-CM

## 2018-10-16 DIAGNOSIS — W0110XA Fall on same level from slipping, tripping and stumbling with subsequent striking against unspecified object, initial encounter: Secondary | ICD-10-CM

## 2018-10-16 DIAGNOSIS — S80212A Abrasion, left knee, initial encounter: Secondary | ICD-10-CM | POA: Diagnosis not present

## 2018-10-16 DIAGNOSIS — Z23 Encounter for immunization: Secondary | ICD-10-CM | POA: Diagnosis not present

## 2018-10-16 DIAGNOSIS — W19XXXA Unspecified fall, initial encounter: Secondary | ICD-10-CM

## 2018-10-16 DIAGNOSIS — T07XXXA Unspecified multiple injuries, initial encounter: Secondary | ICD-10-CM

## 2018-10-16 MED ORDER — LIDOCAINE-EPINEPHRINE-TETRACAINE (LET) SOLUTION
NASAL | Status: AC
  Filled 2018-10-16: qty 3

## 2018-10-16 MED ORDER — TETANUS-DIPHTH-ACELL PERTUSSIS 5-2.5-18.5 LF-MCG/0.5 IM SUSP
INTRAMUSCULAR | Status: AC
  Filled 2018-10-16: qty 0.5

## 2018-10-16 MED ORDER — TETANUS-DIPHTH-ACELL PERTUSSIS 5-2.5-18.5 LF-MCG/0.5 IM SUSP
0.5000 mL | Freq: Once | INTRAMUSCULAR | Status: AC
  Administered 2018-10-16: 0.5 mL via INTRAMUSCULAR

## 2018-10-16 NOTE — ED Provider Notes (Signed)
[facility]   [id number] 10/16/18 Arrival Time: 1156  CC: LACERATION  SUBJECTIVE:  Jacqueline Harrison is a 83 y.o. female who presents with a lip laceration that occurred earlier today.  Patient states she tripped over an uneven brick and fell forward.  Also mentions abrasion to LT wrist, LT fingers, and bilateral knees.  Was sent over from [facility].  Denies LOC, blood thinners, dental pain, HA, vision changes, nausea, vomiting.    Td UTD: Unknown.  ROS: As per HPI.  Past Medical History:  Diagnosis Date  . Allergy   . Anxiety   . Difficult intubation    Potential for difficult intubation if small tube not used" per patient -had issues with this Cholecystectomy 2009-[facility].  Marland Kitchen GERD (gastroesophageal reflux disease)   . Thyroid disease    followed for 2 yrs "thyroid nodules" "stable"   Past Surgical History:  Procedure Laterality Date  . CATARACT EXTRACTION, BILATERAL Bilateral   . CHOLECYSTECTOMY    . COLONOSCOPY WITH PROPOFOL N/A 12/22/2014   Procedure: COLONOSCOPY WITH PROPOFOL;  Surgeon: Charna Elizabeth, MD;  Location: [facility];  Service: Endoscopy;  Laterality: N/A;  . COLONOSCOPY WITH PROPOFOL N/A 01/31/2018   Procedure: COLONOSCOPY WITH PROPOFOL;  Surgeon: Charna Elizabeth, MD;  Location: [facility];  Service: Endoscopy;  Laterality: N/A;  . DIAGNOSTIC LAPAROSCOPY     diagnostic- negative findings -to evaluate ovaries.  Marland Kitchen EYE SURGERY    . TONSILLECTOMY     Allergies  Allergen Reactions  . Peanut-Containing Drug Products Nausea And Vomiting   No current facility-administered medications on file prior to encounter.    Current Outpatient Medications on File Prior to Encounter  Medication Sig Dispense Refill  . aspirin 81 MG tablet Take 81 mg by mouth daily as needed for pain.    Marland Kitchen BLACK PEPPER-TURMERIC PO Take 5 mLs by mouth daily.    . camphor-menthol (SARNA) lotion Apply 1 application topically as needed for itching.    . Carboxymethylcellulose Sodium (REFRESH  PLUS OP) Apply 1-2 drops to eye 2 (two) times daily.     . cholecalciferol (VITAMIN D) 1000 units tablet Take 1 tablet by mouth every morning.     . fluticasone (FLONASE) 50 MCG/ACT nasal spray Place 1-2 sprays into both nostrils daily.     . Multiple Vitamin (MULTIVITAMIN WITH MINERALS) TABS tablet Take 1 tablet by mouth every morning.    . multivitamin-lutein (OCUVITE-LUTEIN) CAPS capsule Take 1 capsule by mouth daily.    . vitamin E 400 UNIT capsule Take 400 Units by mouth daily.     Social History   Socioeconomic History  . Marital status: Married    Spouse name: Not on file  . Number of children: Not on file  . Years of education: Not on file  . Highest education level: Not on file  Occupational History  . Not on file  Social Needs  . Financial resource strain: Not on file  . Food insecurity:    Worry: Not on file    Inability: Not on file  . Transportation needs:    Medical: Not on file    Non-medical: Not on file  Tobacco Use  . Smoking status: Never Smoker  . Smokeless tobacco: Never Used  Substance and Sexual Activity  . Alcohol use: Yes    Comment: wine -occ.  . Drug use: No  . Sexual activity: Not on file  Lifestyle  . Physical activity:    Days per week: Not on file  Minutes per session: Not on file  . Stress: Not on file  Relationships  . Social connections:    Talks on phone: Not on file    Gets together: Not on file    Attends religious service: Not on file    Active member of club or organization: Not on file    Attends meetings of clubs or organizations: Not on file    Relationship status: Not on file  . Intimate partner violence:    Fear of current or ex partner: Not on file    Emotionally abused: Not on file    Physically abused: Not on file    Forced sexual activity: Not on file  Other Topics Concern  . Not on file  Social History Narrative  . Not on file   Family History  Problem Relation Age of Onset  . Cancer Mother   . Hypertension  Mother   . Cancer Father   . Mental illness Father   . Heart disease Father   . Heart disease Maternal Grandmother   . Hyperlipidemia Maternal Grandmother   . Cancer Maternal Grandfather   . Mental illness Maternal Grandfather   . Heart disease Paternal Grandfather   . Cancer Sister   . Hypertension Sister   . Mental illness Sister   . Cancer Brother      OBJECTIVE:  Vitals:   10/16/18 1221  BP: (!) 176/95  Pulse: 88  Resp: 18  Temp: 98.8 F (37.1 C)  TempSrc: Oral  SpO2: 96%     General appearance: alert; no distress Skin: 2 lacerations of left upper lip; size: approx 0.5-1 cm, bleeding controlled; abrasions to anterior left wrist, 3rd and 4th left PIP joints dorsal aspect, bilateral knees, bleeding controlled Extremities: moves all extremities without difficulty Psychological: alert and cooperative; mildly anxious mood and affect   Procedure: Verbal consent obtained. Patient provided with risks and alternatives to the procedure. Anesthetized with 1 mL of lidocaine without epinephrine after LET. Wound carefully explored. No foreign body, or nonviable tissue were noted. Using sterile technique 2 interrupted 7-0 Prolene sutures were placed to reapproximate the wound. Patient tolerated procedure well. No complications. Minimal bleeding. Patient advised to look for and return for any signs of infection such as redness, swelling, discharge, or worsening pain. Return for suture removal and recheck in 4-5 days.  Dr. Tracie HarrierHagler examined laceration prior to and after sutures were applied.    ASSESSMENT & PLAN:  1. Fall, initial encounter   2. Lip laceration, initial encounter   3. Abrasions of multiple sites     Meds ordered this encounter  Medications  . Tdap (BOOSTRIX) injection 0.5 mL   Tetanus updated  Lip laceration: Eat soft foods for two to three days.  Rinse the mouth with water after eating.  Avoid spicy or salty foods until the wound is healed. Avoid the use of  straws (negative pressure may increase ecchymosis or bleeding at the wound site). Return here or follow up with PCP in 4-5 days to have sutures removed  Abrasions:  Bandage applied Wash gently with warm water and mild soap.   Change dressing daily and apply a thin layer of neosporin.   Take OTC tylenol as needed for pain releif  Return sooner or go to the ED if you have any new or worsening symptoms such as increased pain, redness, swelling, drainage, discharge, decreased range of motion of extremity, severe headache, confusion, vision changes, nausea, vomiting, etc..     Reviewed expectations  re: course of current medical issues. Questions answered. Outlined signs and symptoms indicating need for more acute intervention. Patient verbalized understanding. After Visit Summary given.   Rennis Harding, PA-C 10/16/18 1443

## 2018-10-16 NOTE — Discharge Instructions (Signed)
Tetanus updated  Lip laceration: Eat soft foods for two to three days.  Rinse the mouth with water after eating.  Avoid spicy or salty foods until the wound is healed. Avoid the use of straws (negative pressure may increase ecchymosis or bleeding at the wound site). Return here or follow up with PCP in 4-5 days to have sutures removed  Abrasions:  Bandage applied Wash gently with warm water and mild soap.   Change dressing daily and apply a thin layer of neosporin.   Take OTC tylenol as needed for pain releif  Return sooner or go to the ED if you have any new or worsening symptoms such as increased pain, redness, swelling, drainage, discharge, decreased range of motion of extremity, severe headache, confusion, vision changes, nausea, vomiting, etc..

## 2018-10-16 NOTE — ED Triage Notes (Signed)
Pt here for trip and fall today with abrasions to hands and knees; pt with laceration to lip; bleeding controlled; pt denies LOC

## 2018-10-21 ENCOUNTER — Other Ambulatory Visit: Payer: Self-pay

## 2018-10-21 ENCOUNTER — Ambulatory Visit (HOSPITAL_COMMUNITY)
Admission: EM | Admit: 2018-10-21 | Discharge: 2018-10-21 | Disposition: A | Discharge status: Home / Self Care | Payer: Medicare Other

## 2018-10-21 DIAGNOSIS — Z4802 Encounter for removal of sutures: Secondary | ICD-10-CM | POA: Diagnosis not present

## 2018-10-21 NOTE — ED Triage Notes (Signed)
Two stitches removed from left side of upper lip with assistance from NP due to large amount of dried blood over stitches.

## 2019-07-13 ENCOUNTER — Ambulatory Visit: Payer: Medicare Other

## 2019-07-18 ENCOUNTER — Ambulatory Visit: Payer: Medicare Other

## 2019-07-20 ENCOUNTER — Ambulatory Visit: Payer: Medicare Other

## 2019-11-08 ENCOUNTER — Ambulatory Visit (HOSPITAL_COMMUNITY)
Admission: EM | Admit: 2019-11-08 | Discharge: 2019-11-08 | Disposition: A | Discharge status: Home / Self Care | Payer: Medicare Other | Attending: Internal Medicine | Admitting: Internal Medicine

## 2019-11-08 ENCOUNTER — Encounter (HOSPITAL_COMMUNITY): Payer: Self-pay

## 2019-11-08 ENCOUNTER — Other Ambulatory Visit: Payer: Self-pay

## 2019-11-08 DIAGNOSIS — L255 Unspecified contact dermatitis due to plants, except food: Secondary | ICD-10-CM | POA: Diagnosis not present

## 2019-11-08 MED ORDER — HYDROXYZINE HCL 25 MG PO TABS: 25.0000 mg | ORAL_TABLET | Freq: Every evening | ORAL | 0 refills | Status: AC | PRN

## 2019-11-08 NOTE — ED Triage Notes (Signed)
Patient reports she first noticed rash on 5/14. She treated it at home until 5/24. She called her dermatologist who prescribed Triamolone cream 2-3x daily. Patient reports some relief, but still has rash and itching. Rates itching 9/10.

## 2019-11-11 NOTE — ED Provider Notes (Signed)
[facility]    CSN: [id number] Arrival date & time: 11/08/19  1013      History   Chief Complaint Chief Complaint  Patient presents with  . Rash  . Pruritis    HPI Jacqueline Harrison is a 84 y.o. female comes to the urgent care with complaints of a pruritic rash which started a couple of weeks ago.  Patient was exposed to poison ivy a couple of weeks ago.  Patient developed a rash over her forearms.  She was prescribed triamcinolone which helped with the rash.  The rash is improved but the itching is persistent.  No significant erythema.  No fever or chills.Marland Kitchen   HPI  Past Medical History:  Diagnosis Date  . Allergy   . Anxiety   . Difficult intubation    Potential for difficult intubation if small tube not used" per patient -had issues with this Cholecystectomy 2009-[facility].  Marland Kitchen GERD (gastroesophageal reflux disease)   . Thyroid disease    followed for 2 yrs "thyroid nodules" "stable"    Patient Active Problem List   Diagnosis Date Noted  . Shingles 09/02/2016    Past Surgical History:  Procedure Laterality Date  . CATARACT EXTRACTION, BILATERAL Bilateral   . CHOLECYSTECTOMY    . COLONOSCOPY WITH PROPOFOL N/A 12/22/2014   Procedure: COLONOSCOPY WITH PROPOFOL;  Surgeon: Charna Elizabeth, MD;  Location: [facility];  Service: Endoscopy;  Laterality: N/A;  . COLONOSCOPY WITH PROPOFOL N/A 01/31/2018   Procedure: COLONOSCOPY WITH PROPOFOL;  Surgeon: Charna Elizabeth, MD;  Location: [facility];  Service: Endoscopy;  Laterality: N/A;  . DIAGNOSTIC LAPAROSCOPY     diagnostic- negative findings -to evaluate ovaries.  Marland Kitchen EYE SURGERY    . TONSILLECTOMY      OB History   No obstetric history on file.      Home Medications    Prior to Admission medications   Medication Sig Start Date End Date Taking? Authorizing Provider  aspirin 81 MG tablet Take 81 mg by mouth daily as needed for pain.    [provider]  BLACK PEPPER-TURMERIC PO Take 5 mLs by mouth daily.     [provider]  camphor-menthol Wynelle Fanny) lotion Apply 1 application topically as needed for itching.    [provider]  Carboxymethylcellulose Sodium (REFRESH PLUS OP) Apply 1-2 drops to eye 2 (two) times daily.     [provider]  cholecalciferol (VITAMIN D) 1000 units tablet Take 1 tablet by mouth every morning.     [provider]  fluticasone (FLONASE) 50 MCG/ACT nasal spray Place 1-2 sprays into both nostrils daily.     [provider]  hydrOXYzine (ATARAX/VISTARIL) 25 MG tablet Take 1 tablet (25 mg total) by mouth at bedtime as needed for itching. 11/08/19   Yoel Kaufhold, Britta Mccreedy, MD  Multiple Vitamin (MULTIVITAMIN WITH MINERALS) TABS tablet Take 1 tablet by mouth every morning.    [provider]  multivitamin-lutein (OCUVITE-LUTEIN) CAPS capsule Take 1 capsule by mouth daily.    [provider]  vitamin E 400 UNIT capsule Take 400 Units by mouth daily.    [provider]    Family History Family History  Problem Relation Age of Onset  . Cancer Mother   . Hypertension Mother   . Cancer Father   . Mental illness Father   . Heart disease Father   . Heart disease Maternal Grandmother   . Hyperlipidemia Maternal Grandmother   . Cancer Maternal Grandfather   . Mental  illness Maternal Grandfather   . Heart disease Paternal Grandfather   . Cancer Sister   . Hypertension Sister   . Mental illness Sister   . Cancer Brother     Social History Social History   Tobacco Use  . Smoking status: Never Smoker  . Smokeless tobacco: Never Used  Substance Use Topics  . Alcohol use: Yes    Comment: wine -occ.  . Drug use: No     Allergies   Peanut-containing drug products   Review of Systems Review of Systems  Constitutional: Negative.   Genitourinary: Negative.   Musculoskeletal: Negative.   Skin: Positive for color change and rash. Negative for wound.  Neurological: Negative.   Psychiatric/Behavioral:  Negative.      Physical Exam Triage Vital Signs ED Triage Vitals  Enc Vitals Group     BP 11/08/19 1058 (!) 160/88     Pulse Rate 11/08/19 1058 (!) 102     Resp 11/08/19 1058 16     Temp 11/08/19 1058 98.2 F (36.8 C)     Temp Source 11/08/19 1058 Oral     SpO2 11/08/19 1058 100 %     Weight --      Height --      Head Circumference --      Peak Flow --      Pain Score 11/08/19 1056 2     Pain Loc --      Pain Edu? --      Excl. in [place]? --    No data found.  Updated Vital Signs BP (!) 160/88 (BP Location: Left Arm)   Pulse (!) 102   Temp 98.2 F (36.8 C) (Oral)   Resp 16   SpO2 100%   Visual Acuity Right Eye Distance:   Left Eye Distance:   Bilateral Distance:    Right Eye Near:   Left Eye Near:    Bilateral Near:     Physical Exam Vitals and nursing note reviewed.  Constitutional:      General: She is not in acute distress.    Appearance: Normal appearance. She is not ill-appearing.  Cardiovascular:     Rate and Rhythm: Normal rate and regular rhythm.     Pulses: Normal pulses.     Heart sounds: Normal heart sounds.  Pulmonary:     Effort: Pulmonary effort is normal.     Breath sounds: Normal breath sounds.  Abdominal:     General: Bowel sounds are normal.     Palpations: Abdomen is soft.  Skin:    Capillary Refill: Capillary refill takes less than 2 seconds.     Comments: Mildly erythematous rash over the forearms.  No blisters or vesicles.  Few areas with excoriations.  Neurological:     General: No focal deficit present.     Mental Status: She is alert and oriented to person, place, and time.      UC Treatments / Results  Labs (all labs ordered are listed, but only abnormal results are displayed) Labs Reviewed - No data to display  EKG   Radiology No results found.  Procedures Procedures (including critical care time)  Medications Ordered in UC Medications - No data to display  Initial Impression / Assessment and Plan / UC Course   I have reviewed the triage vital signs and the nursing notes.  Pertinent labs & imaging results that were available during my care of the patient were reviewed by me and considered in my medical decision making (  see chart for details).     1. Rhus dermatitis: Hydroxyzine as needed for itching. If patient notices worsening redness or pain, she is advised to return to urgent care to be reevaluated  Final Clinical Impressions(s) / UC Diagnoses   Final diagnoses:  Rhus dermatitis   Discharge Instructions   None    ED Prescriptions    Medication Sig Dispense Auth. Provider   hydrOXYzine (ATARAX/VISTARIL) 25 MG tablet Take 1 tablet (25 mg total) by mouth at bedtime as needed for itching. 12 tablet Teena Mangus, Britta Mccreedy, MD     PDMP not reviewed this encounter.   Merrilee Jansky, MD 11/11/19 1332

## 2022-01-05 ENCOUNTER — Ambulatory Visit (HOSPITAL_COMMUNITY)
Admission: RE | Admit: 2022-01-05 | Discharge: 2022-01-05 | Disposition: A | Discharge status: Home / Self Care | Payer: Medicare Other | Source: Ambulatory Visit | Attending: Family Medicine | Admitting: Family Medicine

## 2022-01-05 ENCOUNTER — Ambulatory Visit (INDEPENDENT_AMBULATORY_CARE_PROVIDER_SITE_OTHER): Payer: Medicare Other

## 2022-01-05 ENCOUNTER — Other Ambulatory Visit: Payer: Self-pay

## 2022-01-05 VITALS — BP 128/85 | HR 76 | Temp 98.9°F | Resp 20

## 2022-01-05 DIAGNOSIS — R1013 Epigastric pain: Secondary | ICD-10-CM

## 2022-01-05 DIAGNOSIS — R197 Diarrhea, unspecified: Secondary | ICD-10-CM | POA: Diagnosis not present

## 2022-01-05 MED ORDER — OMEPRAZOLE 40 MG PO CPDR: 40.0000 mg | DELAYED_RELEASE_CAPSULE | Freq: Every day | ORAL | 1 refills | Status: AC

## 2022-01-05 NOTE — Discharge Instructions (Addendum)
Your x-ray did show a little bit of extra stool in the very lower part of the abdomen.  I cannot tell that that is what is causing your pain at this point.  Start omeprazole 40 mg--1 daily.  This is to reduce the amount of stomach acid you make  Make sure you are drinking plenty of water, and do take a dose of Metamucil daily for a week or 2. We have drawn lab work for a blood count and kidney and liver numbers.  Staff will notify you if anything is significantly abnormal  Please make a follow-up appointment with your primary care office when you get home

## 2022-01-05 NOTE — ED Triage Notes (Signed)
Abdominal pain  and diarrhea that started one week ago.   One week had eaten out and thought symptoms of not feeling well related to food.  Went away for 2 days, then discomfort reoccurred.  No vomiting.  Has had 1 episode of diarrhea today.  Marland Kitchen

## 2022-01-05 NOTE — ED Provider Notes (Addendum)
[facility]    CSN: [id number] Arrival date & time: 01/05/22  1336      History   Chief Complaint Chief Complaint  Patient presents with   Abdominal Pain    Abdominal pain lasting several days.  Referred by [facility]. - Entered by patient   Appointment    2:00pm    HPI Jacqueline Harrison is a 86 y.o. female.    Abdominal Pain  Here for epigastric pain.  Initially she notes a history of diarrhea.  But it seems that she has only had loose stools once she takes Metamucil.  She has taken the Metamucil off-and-on through the last 2 weeks.  It seems that she might go a day or 2 without a bowel movement and then she would take the Metamucil and then have a loose stool.  She might of had some nausea off and on, but no vomiting  This all began about 2 weeks ago.  She linked to possibly having eaten some takeout food.  Symptoms may be improved a couple of days and then worsened again.  No blood in the stool.  Patient overall does well giving her own history, but she does have some trouble recalling names and such.  I asked her husband to join her in the exam room, and he verified the history  Past Medical History:  Diagnosis Date   Allergy    Anxiety    Difficult intubation    Potential for difficult intubation if small tube not used" per patient -had issues with this Cholecystectomy 2009-[facility].   GERD (gastroesophageal reflux disease)    Thyroid disease    followed for 2 yrs "thyroid nodules" "stable"    Patient Active Problem List   Diagnosis Date Noted   Shingles 09/02/2016    Past Surgical History:  Procedure Laterality Date   CATARACT EXTRACTION, BILATERAL Bilateral    CHOLECYSTECTOMY     COLONOSCOPY WITH PROPOFOL N/A 12/22/2014   Procedure: COLONOSCOPY WITH PROPOFOL;  Surgeon: Charna ElizabethJyothi Mann, MD;  Location: [facility];  Service: Endoscopy;  Laterality: N/A;   COLONOSCOPY WITH PROPOFOL N/A 01/31/2018   Procedure: COLONOSCOPY WITH PROPOFOL;  Surgeon:  Charna ElizabethMann, Jyothi, MD;  Location: [facility];  Service: Endoscopy;  Laterality: N/A;   DIAGNOSTIC LAPAROSCOPY     diagnostic- negative findings -to evaluate ovaries.   EYE SURGERY     TONSILLECTOMY      OB History   No obstetric history on file.      Home Medications    Prior to Admission medications   Medication Sig Start Date End Date Taking? Authorizing Provider  omeprazole (PRILOSEC) 40 MG capsule Take 1 capsule (40 mg total) by mouth daily. 01/05/22  Yes Zenia ResidesBanister, Gia Lusher K, MD  aspirin 81 MG tablet Take 81 mg by mouth daily as needed for pain.    [provider]  BLACK PEPPER-TURMERIC PO Take 5 mLs by mouth daily.    [provider]  busPIRone (BUSPAR) 5 MG tablet Take 5 mg by mouth 3 (three) times daily. 12/06/21   [provider]  camphor-menthol Wynelle Fanny(SARNA) lotion Apply 1 application topically as needed for itching.    [provider]  Carboxymethylcellulose Sodium (REFRESH PLUS OP) Apply 1-2 drops to eye 2 (two) times daily.     [provider]  cholecalciferol (VITAMIN D) 1000 units tablet Take 1 tablet by mouth every morning.     [provider]  donepezil (ARICEPT) 5 MG tablet Take 5 mg by mouth  daily. 11/18/21   [provider]  fluticasone (FLONASE) 50 MCG/ACT nasal spray Place 1-2 sprays into both nostrils daily.     [provider]  hydrOXYzine (ATARAX/VISTARIL) 25 MG tablet Take 1 tablet (25 mg total) by mouth at bedtime as needed for itching. 11/08/19   Lamptey, Myrene Galas, MD  Multiple Vitamin (MULTIVITAMIN WITH MINERALS) TABS tablet Take 1 tablet by mouth every morning.    [provider]  multivitamin-lutein (OCUVITE-LUTEIN) CAPS capsule Take 1 capsule by mouth daily.    [provider]  traZODone (DESYREL) 50 MG tablet Take 50 mg by mouth at bedtime. 01/03/22   [provider]  vitamin E 400 UNIT capsule Take 400 Units by mouth daily.    [provider]    Family  History Family History  Problem Relation Age of Onset   Cancer Mother    Hypertension Mother    Cancer Father    Mental illness Father    Heart disease Father    Heart disease Maternal Grandmother    Hyperlipidemia Maternal Grandmother    Cancer Maternal Grandfather    Mental illness Maternal Grandfather    Heart disease Paternal Grandfather    Cancer Sister    Hypertension Sister    Mental illness Sister    Cancer Brother     Social History Social History   Tobacco Use   Smoking status: Never   Smokeless tobacco: Never  Substance Use Topics   Alcohol use: Yes    Comment: wine -occ.   Drug use: No     Allergies   Peanut-containing drug products   Review of Systems Review of Systems  Gastrointestinal:  Positive for abdominal pain.     Physical Exam Triage Vital Signs ED Triage Vitals  Enc Vitals Group     BP 01/05/22 1356 128/85     Pulse Rate 01/05/22 1356 76     Resp 01/05/22 1356 20     Temp 01/05/22 1356 98.9 F (37.2 C)     Temp Source 01/05/22 1356 Oral     SpO2 01/05/22 1356 95 %     Weight --      Height --      Head Circumference --      Peak Flow --      Pain Score 01/05/22 1353 8     Pain Loc --      Pain Edu? --      Excl. in [place]? --    No data found.  Updated Vital Signs BP 128/85 (BP Location: Left Arm)   Pulse 76   Temp 98.9 F (37.2 C) (Oral)   Resp 20   SpO2 95%   Visual Acuity Right Eye Distance:   Left Eye Distance:   Bilateral Distance:    Right Eye Near:   Left Eye Near:    Bilateral Near:     Physical Exam Constitutional:      General: She is not in acute distress.    Appearance: She is not ill-appearing, toxic-appearing or diaphoretic.  HENT:     Mouth/Throat:     Mouth: Mucous membranes are moist.     Pharynx: No oropharyngeal exudate or posterior oropharyngeal erythema.  Eyes:     Extraocular Movements: Extraocular movements intact.     Conjunctiva/sclera: Conjunctivae normal.     Pupils: Pupils are  equal, round, and reactive to light.  Cardiovascular:     Rate and Rhythm: Normal rate and regular rhythm.  Heart sounds: No murmur heard. Pulmonary:     Effort: Pulmonary effort is normal. No respiratory distress.     Breath sounds: No stridor. No wheezing, rhonchi or rales.  Abdominal:     General: There is no distension.     Palpations: Abdomen is soft. There is no mass.     Tenderness: There is abdominal tenderness (Epigastric area and periumbilical area). There is no guarding.  Musculoskeletal:     Cervical back: Neck supple.  Lymphadenopathy:     Cervical: No cervical adenopathy.  Skin:    Coloration: Skin is not jaundiced or pale.  Neurological:     Mental Status: She is alert and oriented to person, place, and time.  Psychiatric:        Behavior: Behavior normal.      UC Treatments / Results  Labs (all labs ordered are listed, but only abnormal results are displayed) Labs Reviewed  CBC  COMPREHENSIVE METABOLIC PANEL    EKG   Radiology DG Abd 1 View  Result Date: 01/05/2022 CLINICAL DATA:  Epigastric abdominal pain and diarrhea. EXAM: ABDOMEN - 1 VIEW COMPARISON:  None Available. FINDINGS: The lung bases are clear. The abdominal bowel gas pattern is unremarkable. No findings to suggest obstruction or perforation. The soft tissue shadows are maintained. No worrisome calcifications. The bony structures are intact. IMPRESSION: No plain film findings for an acute abdominal process. Electronically Signed   By: Rudie Meyer M.D.   On: 01/05/2022 14:27    Procedures Procedures (including critical care time)  Medications Ordered in UC Medications - No data to display  Initial Impression / Assessment and Plan / UC Course  I have reviewed the triage vital signs and the nursing notes.  Pertinent labs & imaging results that were available during my care of the patient were reviewed by me and considered in my medical decision making (see chart for details).      X-ray does show some stool in the lower abdomen but the overall burden is not too much.  I am going to have her do regular dosing of either Metamucil or MiraLAX, and add some omeprazole.  I am asking her to follow-up with her primary office  I had intended to do a CBC and chemistry panel, but she was discharged by me without having that done.  She is going to be able to follow-up easily with her primary care, her vital signs were stable here and she was in no distress.  I have therefore canceled the blood count and the chemistry panel Final Clinical Impressions(s) / UC Diagnoses   Final diagnoses:  Epigastric pain     Discharge Instructions      Your x-ray did show a little bit of extra stool in the very lower part of the abdomen.  I cannot tell that that is what is causing your pain at this point.  Start omeprazole 40 mg--1 daily.  This is to reduce the amount of stomach acid you make  Make sure you are drinking plenty of water, and do take a dose of Metamucil daily for a week or 2. We have drawn lab work for a blood count and kidney and liver numbers.  Staff will notify you if anything is significantly abnormal  Please make a follow-up appointment with your primary care office when you get home     ED Prescriptions     Medication Sig Dispense Auth. Provider   omeprazole (PRILOSEC) 40 MG capsule Take 1 capsule (40 mg  total) by mouth daily. 30 capsule Zenia Resides, MD      PDMP not reviewed this encounter.   Zenia Resides, MD 01/05/22 1453    Zenia Resides, MD 01/05/22 [phone]

## 2022-03-24 ENCOUNTER — Encounter (HOSPITAL_COMMUNITY): Payer: Self-pay | Admitting: *Deleted

## 2022-03-24 ENCOUNTER — Ambulatory Visit (HOSPITAL_COMMUNITY)
Admission: EM | Admit: 2022-03-24 | Discharge: 2022-03-24 | Disposition: A | Discharge status: Home / Self Care | Payer: Medicare Other

## 2022-03-24 DIAGNOSIS — I83893 Varicose veins of bilateral lower extremities with other complications: Secondary | ICD-10-CM

## 2022-03-24 NOTE — ED Provider Notes (Signed)
[place]    CSN: [id number] Arrival date & time: 03/24/22  1313      History   Chief Complaint Chief Complaint  Patient presents with   multiple concerns    HPI Jacqueline Harrison is a 86 y.o. female.   86 year old female presents with multiple questions and varicose veins.  Patient indicates that she has been having for the past several weeks intermittent pain at night and noticed that she has varicose veins on both legs.  She indicates that she is trying to find out what can be done to help improve the varicose veins.  She relates she has seen a dermatologist before in [facility] dermatology when she had a cyst and it was removed.  And she was wondering if dermatology would be the correct specialty to have the varicose veins evaluated at.  She also indicates she has taken several different medicines both prescription and over-the-counter and she was wanting to the review the list to ensure that these medications are okay to be taking.  She was asking specifically about the Fosamax which she has taken daily for her bones.  She is not having any problems with present medications.  She does indicate that she has a PCP and this is at [facility] that she sees on a regular basis. Patient has been diagnosed with mild Alzheimer's type dementia diagnosed within the past 4 to 5 months.  And is taking donezepil to help slow the decline.  She is also taking medications to help her sleep at night.  Her husband is sitting out in the waiting room and she wanted to be seen alone first but then she asked if her husband could come back into the exam room with her.     Past Medical History:  Diagnosis Date   Allergy    Anxiety    Difficult intubation    Potential for difficult intubation if small tube not used" per patient -had issues with this Cholecystectomy 2009-[facility].   GERD (gastroesophageal reflux disease)    Thyroid disease    followed for 2 yrs "thyroid nodules" "stable"     Patient Active Problem List   Diagnosis Date Noted   Shingles 09/02/2016    Past Surgical History:  Procedure Laterality Date   CATARACT EXTRACTION, BILATERAL Bilateral    CHOLECYSTECTOMY     COLONOSCOPY WITH PROPOFOL N/A 12/22/2014   Procedure: COLONOSCOPY WITH PROPOFOL;  Surgeon: Juanita Craver, MD;  Location: [facility];  Service: Endoscopy;  Laterality: N/A;   COLONOSCOPY WITH PROPOFOL N/A 01/31/2018   Procedure: COLONOSCOPY WITH PROPOFOL;  Surgeon: Juanita Craver, MD;  Location: [facility];  Service: Endoscopy;  Laterality: N/A;   DIAGNOSTIC LAPAROSCOPY     diagnostic- negative findings -to evaluate ovaries.   EYE SURGERY     TONSILLECTOMY      OB History   No obstetric history on file.      Home Medications    Prior to Admission medications   Medication Sig Start Date End Date Taking? Authorizing Provider  alendronate (FOSAMAX) 70 MG tablet Take 70 mg by mouth once a week. 02/27/22  Yes [provider]  busPIRone (BUSPAR) 5 MG tablet Take 5 mg by mouth 3 (three) times daily. 12/06/21  Yes [provider]  Carboxymethylcellulose Sodium (REFRESH PLUS OP) Apply 1-2 drops to eye 2 (two) times daily.    Yes [provider]  cholecalciferol (VITAMIN D) 1000 units tablet Take 1 tablet by mouth every morning.  Yes [provider]  donepezil (ARICEPT) 5 MG tablet Take 5 mg by mouth daily. 11/18/21  Yes [provider]  fluticasone (FLONASE) 50 MCG/ACT nasal spray Place 1-2 sprays into both nostrils daily.   Yes [provider]  MELATONIN PO Take by mouth.   Yes [provider]  mirtazapine (REMERON) 15 MG tablet Take 15 mg by mouth at bedtime. 03/20/22  Yes [provider]  Multiple Vitamin (MULTIVITAMIN WITH MINERALS) TABS tablet Take 1 tablet by mouth every morning.   Yes [provider]  omeprazole (PRILOSEC) 40 MG capsule Take 1 capsule (40 mg total) by mouth daily. 01/05/22  Yes Barrett Henle,  MD  traZODone (DESYREL) 50 MG tablet Take 50 mg by mouth at bedtime. 01/03/22  Yes [provider]  aspirin 81 MG tablet Take 81 mg by mouth daily as needed for pain.    [provider]  BLACK PEPPER-TURMERIC PO Take 5 mLs by mouth daily.    [provider]  camphor-menthol Timoteo Ace) lotion Apply 1 application topically as needed for itching.    [provider]  hydrOXYzine (ATARAX/VISTARIL) 25 MG tablet Take 1 tablet (25 mg total) by mouth at bedtime as needed for itching. 11/08/19   Lamptey, Myrene Galas, MD  multivitamin-lutein [facility]) CAPS capsule Take 1 capsule by mouth daily.    [provider]  vitamin E 400 UNIT capsule Take 400 Units by mouth daily.    [provider]    Family History Family History  Problem Relation Age of Onset   Cancer Mother    Hypertension Mother    Cancer Father    Mental illness Father    Heart disease Father    Heart disease Maternal Grandmother    Hyperlipidemia Maternal Grandmother    Cancer Maternal Grandfather    Mental illness Maternal Grandfather    Heart disease Paternal Grandfather    Cancer Sister    Hypertension Sister    Mental illness Sister    Cancer Brother     Social History Social History   Tobacco Use   Smoking status: Never   Smokeless tobacco: Never  Vaping Use   Vaping Use: Never used  Substance Use Topics   Alcohol use: Yes    Comment: wine -occ.   Drug use: No     Allergies   Peanut-containing drug products   Review of Systems Review of Systems  Skin:  Positive for rash (varicose veins on legs).     Physical Exam Triage Vital Signs ED Triage Vitals  Enc Vitals Group     BP 03/24/22 1429 (!) 199/103     Pulse Rate 03/24/22 1429 73     Resp 03/24/22 1429 18     Temp 03/24/22 1429 98.9 F (37.2 C)     Temp Source 03/24/22 1429 Oral     SpO2 03/24/22 1429 97 %     Weight --      Height --      Head Circumference --      Peak Flow --      Pain  Score 03/24/22 1428 0     Pain Loc --      Pain Edu? --      Excl. in [place]? --    No data found.  Updated Vital Signs BP (!) 199/103 (BP Location: Left Arm)   Pulse 73   Temp 98.9 F (37.2 C) (Oral)   Resp 18   SpO2 97%   Visual  Acuity Right Eye Distance:   Left Eye Distance:   Bilateral Distance:    Right Eye Near:   Left Eye Near:    Bilateral Near:     Physical Exam Constitutional:      Appearance: Normal appearance.  Skin:    Comments: Bilateral lower legs: There are several small 2 to 3 cm small varicose veins located on both legs without swelling or redness.  No ankle swelling is present.  Neurological:     Mental Status: She is alert.      UC Treatments / Results  Labs (all labs ordered are listed, but only abnormal results are displayed) Labs Reviewed - No data to display  EKG   Radiology No results found.  Procedures Procedures (including critical care time)  Medications Ordered in UC Medications - No data to display  Initial Impression / Assessment and Plan / UC Course  I have reviewed the triage vital signs and the nursing notes.  Pertinent labs & imaging results that were available during my care of the patient were reviewed by me and considered in my medical decision making (see chart for details).    Plan: 1.  The varicose veins will be treated with the following: A.  Advised for the patient to call [facility] dermatology to set up an appointment to have varicose veins evaluated. 2.  Patient has been advised to follow-up with [facility]CP [facility] for further evaluation of any medical issues. 3.  Advised to return to urgent care as needed. Final Clinical Impressions(s) / UC Diagnoses   Final diagnoses:  Varicose veins of bilateral lower extremities with other complications     Discharge Instructions      For your varicose veins that are on both legs you can call [facility] dermatology at [phone].  It would be good to make an  appointment with them so that you can go and let them evaluate to see if they can offer any treatment for the varicose veins. Advised to follow-up with your primary care physician or return to urgent care as needed.    ED Prescriptions   None    PDMP not reviewed this encounter.   Nyoka Lint, PA-C 03/24/22 1504

## 2022-03-24 NOTE — Discharge Instructions (Addendum)
For your varicose veins that are on both legs you can call [facility] dermatology at [phone].  It would be good to make an appointment with them so that you can go and let them evaluate to see if they can offer any treatment for the varicose veins. Advised to follow-up with your primary care physician or return to urgent care as needed.

## 2022-03-24 NOTE — ED Triage Notes (Signed)
Pt states that she is here today to ask questions about her care. She has said her husband does her meds and she has some questions, she states she needs to know about fosamax. She is taking trazodone and melatonin and she is still not sleeping.   She said she thought she was to get a new mattress but that wasn't her husbands plan, she said the mattress is hard and she has some veins in her legs that bother her and she needs some advice on those. She has seen a dermatologist in the past so she needs more advice on that.   She wasn't pleased about her COVID booster and her arm was sore over a week but it is better now.    She had her husband wait in the lobby she did not want him back here. She doesn't have much enjoyment in life and she feels she doesn't have enough time.    Pt has a couple of pages of notes.
